# Patient Record
Sex: Female | Born: 1950 | Race: White | Hispanic: No | Smoking: Former smoker
Health system: Southern US, Community
[De-identification: ages and names within clinical notes are randomized; demographics above are authoritative.]

## PROBLEM LIST (undated history)

## (undated) DIAGNOSIS — E785 Hyperlipidemia, unspecified: Secondary | ICD-10-CM

## (undated) DIAGNOSIS — C50919 Malignant neoplasm of unspecified site of unspecified female breast: Secondary | ICD-10-CM

## (undated) DIAGNOSIS — K219 Gastro-esophageal reflux disease without esophagitis: Secondary | ICD-10-CM

## (undated) DIAGNOSIS — C439 Malignant melanoma of skin, unspecified: Secondary | ICD-10-CM

## (undated) DIAGNOSIS — E119 Type 2 diabetes mellitus without complications: Secondary | ICD-10-CM

## (undated) HISTORY — PX: MASTECTOMY, PARTIAL: SHX709

## (undated) HISTORY — PX: BREAST LUMPECTOMY: SHX2

---

## 2004-09-23 DIAGNOSIS — C50919 Malignant neoplasm of unspecified site of unspecified female breast: Secondary | ICD-10-CM

## 2004-09-23 HISTORY — PX: BREAST BIOPSY: SHX20

## 2004-09-23 HISTORY — DX: Malignant neoplasm of unspecified site of unspecified female breast: C50.919

## 2005-02-11 ENCOUNTER — Ambulatory Visit: Payer: Self-pay | Admitting: Internal Medicine

## 2005-02-18 ENCOUNTER — Ambulatory Visit: Payer: Self-pay | Admitting: Internal Medicine

## 2005-02-26 ENCOUNTER — Ambulatory Visit: Payer: Self-pay | Admitting: Internal Medicine

## 2005-03-06 ENCOUNTER — Ambulatory Visit: Payer: Self-pay | Admitting: Surgery

## 2005-03-22 ENCOUNTER — Ambulatory Visit: Payer: Self-pay | Admitting: Oncology

## 2005-04-01 ENCOUNTER — Ambulatory Visit: Payer: Self-pay | Admitting: Oncology

## 2005-04-23 ENCOUNTER — Ambulatory Visit: Payer: Self-pay | Admitting: Oncology

## 2005-05-24 ENCOUNTER — Ambulatory Visit: Payer: Self-pay | Admitting: Oncology

## 2005-06-23 ENCOUNTER — Ambulatory Visit: Payer: Self-pay | Admitting: Oncology

## 2005-11-06 ENCOUNTER — Ambulatory Visit: Payer: Self-pay | Admitting: Oncology

## 2005-12-18 ENCOUNTER — Ambulatory Visit: Payer: Self-pay | Admitting: Oncology

## 2005-12-24 ENCOUNTER — Ambulatory Visit: Payer: Self-pay | Admitting: Oncology

## 2006-01-21 ENCOUNTER — Ambulatory Visit: Payer: Self-pay | Admitting: Oncology

## 2006-05-05 ENCOUNTER — Ambulatory Visit: Payer: Self-pay | Admitting: Oncology

## 2006-06-24 ENCOUNTER — Ambulatory Visit: Payer: Self-pay | Admitting: Oncology

## 2006-11-10 DIAGNOSIS — E782 Mixed hyperlipidemia: Secondary | ICD-10-CM | POA: Insufficient documentation

## 2006-11-10 DIAGNOSIS — I10 Essential (primary) hypertension: Secondary | ICD-10-CM | POA: Insufficient documentation

## 2006-12-19 ENCOUNTER — Ambulatory Visit: Payer: Self-pay | Admitting: Nurse Practitioner

## 2006-12-22 ENCOUNTER — Ambulatory Visit: Payer: Self-pay | Admitting: Oncology

## 2006-12-23 ENCOUNTER — Ambulatory Visit: Payer: Self-pay | Admitting: Oncology

## 2007-04-24 ENCOUNTER — Ambulatory Visit: Payer: Self-pay | Admitting: Oncology

## 2007-05-07 ENCOUNTER — Ambulatory Visit: Payer: Self-pay | Admitting: Oncology

## 2007-05-25 ENCOUNTER — Ambulatory Visit: Payer: Self-pay | Admitting: Oncology

## 2007-06-24 ENCOUNTER — Ambulatory Visit: Payer: Self-pay | Admitting: Oncology

## 2007-12-21 ENCOUNTER — Ambulatory Visit: Payer: Self-pay | Admitting: Internal Medicine

## 2007-12-23 ENCOUNTER — Ambulatory Visit: Payer: Self-pay | Admitting: Oncology

## 2008-01-22 ENCOUNTER — Ambulatory Visit: Payer: Self-pay | Admitting: Oncology

## 2008-03-23 ENCOUNTER — Ambulatory Visit: Payer: Self-pay | Admitting: Oncology

## 2008-07-04 ENCOUNTER — Ambulatory Visit: Payer: Self-pay | Admitting: Oncology

## 2008-07-24 ENCOUNTER — Ambulatory Visit: Payer: Self-pay | Admitting: Oncology

## 2008-12-21 ENCOUNTER — Ambulatory Visit: Payer: Self-pay | Admitting: Oncology

## 2008-12-22 ENCOUNTER — Ambulatory Visit: Payer: Self-pay | Admitting: Oncology

## 2009-01-03 ENCOUNTER — Ambulatory Visit: Payer: Self-pay | Admitting: Oncology

## 2009-01-21 ENCOUNTER — Ambulatory Visit: Payer: Self-pay | Admitting: Oncology

## 2009-06-22 ENCOUNTER — Ambulatory Visit: Payer: Self-pay | Admitting: Internal Medicine

## 2009-06-28 ENCOUNTER — Encounter: Payer: Self-pay | Admitting: Internal Medicine

## 2009-07-24 ENCOUNTER — Encounter: Payer: Self-pay | Admitting: Internal Medicine

## 2009-09-04 DIAGNOSIS — E119 Type 2 diabetes mellitus without complications: Secondary | ICD-10-CM | POA: Insufficient documentation

## 2009-12-22 ENCOUNTER — Ambulatory Visit: Payer: Self-pay | Admitting: Oncology

## 2010-01-02 ENCOUNTER — Ambulatory Visit: Payer: Self-pay | Admitting: Oncology

## 2010-01-21 ENCOUNTER — Ambulatory Visit: Payer: Self-pay | Admitting: Oncology

## 2011-01-07 ENCOUNTER — Ambulatory Visit: Payer: Self-pay | Admitting: Oncology

## 2011-01-09 ENCOUNTER — Ambulatory Visit: Payer: Self-pay | Admitting: Oncology

## 2011-01-10 LAB — CANCER ANTIGEN 27.29: CA 27.29: 8.4 U/mL (ref 0.0–38.6)

## 2011-01-22 ENCOUNTER — Ambulatory Visit: Payer: Self-pay | Admitting: Oncology

## 2012-02-03 ENCOUNTER — Ambulatory Visit: Payer: Self-pay | Admitting: Oncology

## 2012-02-04 ENCOUNTER — Ambulatory Visit: Payer: Self-pay | Admitting: Oncology

## 2012-02-22 ENCOUNTER — Ambulatory Visit: Payer: Self-pay | Admitting: Oncology

## 2013-02-23 ENCOUNTER — Ambulatory Visit: Payer: Self-pay | Admitting: Internal Medicine

## 2014-05-11 ENCOUNTER — Ambulatory Visit: Payer: Self-pay | Admitting: Internal Medicine

## 2015-10-12 ENCOUNTER — Other Ambulatory Visit: Payer: Self-pay | Admitting: Internal Medicine

## 2015-10-12 DIAGNOSIS — Z1231 Encounter for screening mammogram for malignant neoplasm of breast: Secondary | ICD-10-CM

## 2015-10-16 ENCOUNTER — Ambulatory Visit
Admission: RE | Admit: 2015-10-16 | Discharge: 2015-10-16 | Disposition: A | Discharge status: Home / Self Care | Payer: Medicare Other | Source: Ambulatory Visit | Attending: Internal Medicine | Admitting: Internal Medicine

## 2015-10-16 ENCOUNTER — Other Ambulatory Visit: Payer: Self-pay | Admitting: Internal Medicine

## 2015-10-16 DIAGNOSIS — Z1231 Encounter for screening mammogram for malignant neoplasm of breast: Secondary | ICD-10-CM | POA: Insufficient documentation

## 2015-10-16 HISTORY — DX: Malignant neoplasm of unspecified site of unspecified female breast: C50.919

## 2015-12-01 ENCOUNTER — Encounter: Payer: Self-pay | Admitting: *Deleted

## 2015-12-04 ENCOUNTER — Encounter: Payer: Self-pay | Admitting: *Deleted

## 2015-12-04 ENCOUNTER — Ambulatory Visit: Payer: Medicare Other | Admitting: Anesthesiology

## 2015-12-04 ENCOUNTER — Encounter
Admission: RE | Disposition: A | Discharge status: Home / Self Care | Payer: Self-pay | Source: Ambulatory Visit | Attending: Gastroenterology

## 2015-12-04 ENCOUNTER — Ambulatory Visit
Admission: RE | Admit: 2015-12-04 | Discharge: 2015-12-04 | Disposition: A | Discharge status: Home / Self Care | Payer: Medicare Other | Source: Ambulatory Visit | Attending: Gastroenterology | Admitting: Gastroenterology

## 2015-12-04 DIAGNOSIS — K640 First degree hemorrhoids: Secondary | ICD-10-CM | POA: Insufficient documentation

## 2015-12-04 DIAGNOSIS — Z1211 Encounter for screening for malignant neoplasm of colon: Secondary | ICD-10-CM | POA: Diagnosis not present

## 2015-12-04 DIAGNOSIS — Z885 Allergy status to narcotic agent status: Secondary | ICD-10-CM | POA: Diagnosis not present

## 2015-12-04 DIAGNOSIS — Z85820 Personal history of malignant melanoma of skin: Secondary | ICD-10-CM | POA: Insufficient documentation

## 2015-12-04 DIAGNOSIS — D125 Benign neoplasm of sigmoid colon: Secondary | ICD-10-CM | POA: Diagnosis not present

## 2015-12-04 DIAGNOSIS — R12 Heartburn: Secondary | ICD-10-CM | POA: Diagnosis present

## 2015-12-04 DIAGNOSIS — K219 Gastro-esophageal reflux disease without esophagitis: Secondary | ICD-10-CM | POA: Insufficient documentation

## 2015-12-04 DIAGNOSIS — D122 Benign neoplasm of ascending colon: Secondary | ICD-10-CM | POA: Diagnosis not present

## 2015-12-04 DIAGNOSIS — Z7984 Long term (current) use of oral hypoglycemic drugs: Secondary | ICD-10-CM | POA: Diagnosis not present

## 2015-12-04 DIAGNOSIS — E119 Type 2 diabetes mellitus without complications: Secondary | ICD-10-CM | POA: Diagnosis not present

## 2015-12-04 DIAGNOSIS — E785 Hyperlipidemia, unspecified: Secondary | ICD-10-CM | POA: Insufficient documentation

## 2015-12-04 DIAGNOSIS — Z7982 Long term (current) use of aspirin: Secondary | ICD-10-CM | POA: Insufficient documentation

## 2015-12-04 DIAGNOSIS — Z87891 Personal history of nicotine dependence: Secondary | ICD-10-CM | POA: Diagnosis not present

## 2015-12-04 DIAGNOSIS — Z853 Personal history of malignant neoplasm of breast: Secondary | ICD-10-CM | POA: Insufficient documentation

## 2015-12-04 HISTORY — DX: Gastro-esophageal reflux disease without esophagitis: K21.9

## 2015-12-04 HISTORY — DX: Hyperlipidemia, unspecified: E78.5

## 2015-12-04 HISTORY — PX: COLONOSCOPY WITH PROPOFOL: SHX5780

## 2015-12-04 HISTORY — DX: Malignant melanoma of skin, unspecified: C43.9

## 2015-12-04 HISTORY — DX: Type 2 diabetes mellitus without complications: E11.9

## 2015-12-04 LAB — GLUCOSE, CAPILLARY: Glucose-Capillary: 110 mg/dL — ABNORMAL HIGH (ref 65–99)

## 2015-12-04 SURGERY — COLONOSCOPY WITH PROPOFOL
Anesthesia: General

## 2015-12-04 MED ORDER — LIDOCAINE HCL (CARDIAC) 20 MG/ML IV SOLN
INTRAVENOUS | Status: DC | PRN
  Administered 2015-12-04: 100 mg via INTRAVENOUS

## 2015-12-04 MED ORDER — PROPOFOL 500 MG/50ML IV EMUL
INTRAVENOUS | Status: DC | PRN
  Administered 2015-12-04: 100 ug/kg/min via INTRAVENOUS

## 2015-12-04 MED ORDER — GLYCOPYRROLATE 0.2 MG/ML IJ SOLN
INTRAMUSCULAR | Status: DC | PRN
  Administered 2015-12-04: 0.2 mg via INTRAVENOUS

## 2015-12-04 MED ORDER — PHENYLEPHRINE HCL 10 MG/ML IJ SOLN
INTRAMUSCULAR | Status: DC | PRN
  Administered 2015-12-04: 100 ug via INTRAVENOUS

## 2015-12-04 MED ORDER — SODIUM CHLORIDE 0.9 % IV SOLN
INTRAVENOUS | Status: DC
  Administered 2015-12-04: 08:00:00 via INTRAVENOUS

## 2015-12-04 MED ORDER — PROPOFOL 10 MG/ML IV BOLUS
INTRAVENOUS | Status: DC | PRN
  Administered 2015-12-04: 70 mg via INTRAVENOUS

## 2015-12-04 MED ORDER — FENTANYL CITRATE (PF) 100 MCG/2ML IJ SOLN
INTRAMUSCULAR | Status: DC | PRN
  Administered 2015-12-04: 50 ug via INTRAVENOUS

## 2015-12-04 MED ORDER — MIDAZOLAM HCL 5 MG/5ML IJ SOLN
INTRAMUSCULAR | Status: DC | PRN
  Administered 2015-12-04: 1 mg via INTRAVENOUS

## 2015-12-04 NOTE — H&P (Signed)
  Primary Care Physician:  Perrin Maltese, MD  Pre-Procedure History & Physical: HPI:  Phyllis Hall is a 65 y.o. female is here for an endoscopy/colonoscopy   Past Medical History  Diagnosis Date  . Breast cancer ([place]) 2006    right breast with lumpectomy and rad tx  . Diabetes mellitus without complication ([place])   . Hyperlipidemia   . Melanoma ([place])   . GERD (gastroesophageal reflux disease)     Past Surgical History  Procedure Laterality Date  . Mastectomy, partial    . Breast lumpectomy      Prior to Admission medications   Medication Sig Start Date End Date Taking? Authorizing Provider  aspirin 81 MG tablet Take 81 mg by mouth daily.   Yes Historical Provider, MD  calcium carbonate (OSCAL) 1500 (600 Ca) MG TABS tablet Take 600 mg of elemental calcium by mouth 2 (two) times daily with a meal.   Yes Historical Provider, MD  esomeprazole (NEXIUM) 20 MG capsule Take 20 mg by mouth daily at 12 noon.   Yes Historical Provider, MD  fluticasone (FLONASE) 50 MCG/ACT nasal spray Place into both nostrils daily.   Yes Historical Provider, MD  metFORMIN (GLUCOPHAGE) 500 MG tablet Take by mouth 2 (two) times daily with a meal.   Yes Historical Provider, MD  montelukast (SINGULAIR) 10 MG tablet Take 10 mg by mouth at bedtime.   Yes Historical Provider, MD  rosuvastatin (CRESTOR) 20 MG tablet Take 20 mg by mouth daily.   Yes Historical Provider, MD    Allergies as of 12/01/2015 - Review Complete 12/01/2015  Allergen Reaction Noted  . Codeine  12/01/2015    Family History  Problem Relation Age of Onset  . Breast cancer Maternal Aunt 6    Social History   Social History  . Marital Status: Married    Spouse Name: N/A  . Number of Children: N/A  . Years of Education: N/A   Occupational History  . Not on file.   Social History Main Topics  . Smoking status: Former Smoker    Quit date: 10/13/1993  . Smokeless tobacco: Not on file  . Alcohol Use: 0.0 oz/week    0 Glasses  of wine per week  . Drug Use: No  . Sexual Activity: Not on file   Other Topics Concern  . Not on file   Social History Narrative     Physical Exam: BP 133/85 mmHg  Pulse 84  Temp(Src) 96.2 F (35.7 C) (Tympanic)  Resp 17  Ht 5\' 5"  (1.651 m)  Wt 75.297 kg (166 lb)  BMI 27.62 kg/m2  SpO2 99% General:   Alert,  pleasant and cooperative in NAD Head:  Normocephalic and atraumatic. Neck:  Supple; no masses or thyromegaly. Lungs:  Clear throughout to auscultation.    Heart:  Regular rate and rhythm. Abdomen:  Soft, nontender and nondistended. Normal bowel sounds, without guarding, and without rebound.   Neurologic:  Alert and  oriented x4;  grossly normal neurologically.  Impression/Plan: Phyllis Hall is here for an endoscopy to be performed for GERD, colon for screening  Risks, benefits, limitations, and alternatives regarding  Endoscopy/colonoscopy have been reviewed with the patient.  Questions have been answered.  All parties agreeable.   Josefine Class, MD  12/04/2015, 8:42 AM

## 2015-12-04 NOTE — Transfer of Care (Signed)
Immediate Anesthesia Transfer of Care Note  Patient: Phyllis Hall  Procedure(s) Performed: Procedure(s): COLONOSCOPY WITH PROPOFOL (N/A)  Patient Location: PACU  Anesthesia Type:General  Level of Consciousness: awake and patient cooperative  Airway & Oxygen Therapy: Patient Spontanous Breathing and Patient connected to nasal cannula oxygen  Post-op Assessment: Report given to RN and Post -op Vital signs reviewed and stable  Post vital signs: Reviewed and stable  Last Vitals:  Filed Vitals:   12/04/15 0935 12/04/15 0936  BP: 99/70 99/70  Pulse: 85 82  Temp: 36 C 36 C  Resp:  14    Complications: No apparent anesthesia complications

## 2015-12-04 NOTE — Op Note (Signed)
[address] [facility] Gastroenterology Patient Name: Phyllis Hall Procedure Date: 12/04/2015 8:43 AM MRN: [id number] Account #: [id number] Date of Birth: 1950-12-18 Admit Type: Outpatient Age: 65 Room: [facility] ENDO ROOM 1 Gender: Female Note Status: Finalized Procedure:            Upper GI endoscopy Indications:          Heartburn, Suspected esophageal reflux Patient Profile:      This is a 65 year old female. Providers:            Gerrit Heck. Rayann Heman, MD Referring MD:         Perrin Maltese, MD (Referring MD) Medicines:            Propofol per Anesthesia Complications:        No immediate complications. Procedure:            Pre-Anesthesia Assessment:                       - Prior to the procedure, a History and Physical was                        performed, and patient medications, allergies and                        sensitivities were reviewed. The patient's tolerance of                        previous anesthesia was reviewed.                       After obtaining informed consent, the endoscope was                        passed under direct vision. Throughout the procedure,                        the patient's blood pressure, pulse, and oxygen                        saturations were monitored continuously. The Endoscope                        was introduced through the mouth, and advanced to the                        second part of duodenum. The upper GI endoscopy was                        accomplished without difficulty. The patient tolerated                        the procedure well. Findings:      The esophagus was normal.      The stomach was normal.      The examined duodenum was normal.      Three biopsies were obtained with cold forceps for histology randomly at       the gastroesophageal junction. Impression:           - Normal esophagus.                       - Normal  stomach.                       - Normal examined duodenum.                       - Three  biopsies were obtained at the gastroesophageal                        junction. Recommendation:       - Perform a colonoscopy today.                       - Await pathology results.                       - Taking lowest dose of anti-acid medicine that                        effectively treats symptoms.                       - The findings and recommendations were discussed with                        the patient.                       - The findings and recommendations were discussed with                        the patient's family. Procedure Code(s):    --- Professional ---                       [phone], Esophagogastroduodenoscopy, flexible, transoral;                        with biopsy, single or multiple Diagnosis Code(s):    --- Professional ---                       R12, Heartburn CPT copyright 2016 American Medical Association. All rights reserved. The codes documented in this report are preliminary and upon coder review may  be revised to meet current compliance requirements. Mellody Life, MD 12/04/2015 9:01:49 AM This report has been signed electronically. Number of Addenda: 0 Note Initiated On: 12/04/2015 8:43 AM      [facility]

## 2015-12-04 NOTE — Op Note (Signed)
[facility] Gastroenterology Patient Name: Phyllis Hall Procedure Date: 12/04/2015 9:02 AM MRN: [id number] Account #: [id number] Date of Birth: 03/29/1951 Admit Type: Outpatient Age: 65 Room: [facility] ENDO ROOM 1 Gender: Female Note Status: Finalized Procedure:            Colonoscopy Indications:          Screening for colorectal malignant neoplasm, Last                        colonoscopy 10 years ago Patient Profile:      This is a 65 year old female. Providers:            Gerrit Heck. Rayann Heman, MD Referring MD:         Perrin Maltese, MD (Referring MD) Medicines:            Propofol per Anesthesia Complications:        No immediate complications. Procedure:            Pre-Anesthesia Assessment:                       - Prior to the procedure, a History and Physical was                        performed, and patient medications, allergies and                        sensitivities were reviewed. The patient's tolerance of                        previous anesthesia was reviewed.                       After obtaining informed consent, the colonoscope was                        passed under direct vision. Throughout the procedure,                        the patient's blood pressure, pulse, and oxygen                        saturations were monitored continuously. The                        Colonoscope was introduced through the anus and                        advanced to the the cecum, identified by appendiceal                        orifice and ileocecal valve. The colonoscopy was                        performed without difficulty. The patient tolerated the                        procedure well. The quality of the bowel preparation                        was good. Findings:      The perianal  and digital rectal examinations were normal.      A 4 mm polyp was found in the ascending colon. The polyp was sessile.       The polyp was removed with a cold snare. Resection and  retrieval were       complete.      A 3 mm polyp was found in the recto-sigmoid colon. The polyp was       sessile. The polyp was removed with a jumbo cold forceps. Resection and       retrieval were complete.      Internal hemorrhoids were found during retroflexion. The hemorrhoids       were Grade I (internal hemorrhoids that do not prolapse).      The exam was otherwise without abnormality. Impression:           - One 4 mm polyp in the ascending colon, removed with a                        cold snare. Resected and retrieved.                       - One 3 mm polyp at the recto-sigmoid colon, removed                        with a jumbo cold forceps. Resected and retrieved.                       - Internal hemorrhoids.                       - The examination was otherwise normal. Recommendation:       - Observe patient in GI recovery unit.                       - Resume regular diet.                       - Continue present medications.                       - Await pathology results.                       - Repeat colonoscopy for surveillance based on                        pathology results.                       - Return to referring physician.                       - The findings and recommendations were discussed with                        the patient.                       - The findings and recommendations were discussed with                        the patient's family. Procedure Code(s):    --- Professional ---  45385, Colonoscopy, flexible; with removal of tumor(s),                        polyp(s), or other lesion(s) by snare technique                       45380, 59, Colonoscopy, flexible; with biopsy, single                        or multiple Diagnosis Code(s):    --- Professional ---                       Z12.11, Encounter for screening for malignant neoplasm                        of colon                       D12.2, Benign neoplasm of ascending  colon                       D12.7, Benign neoplasm of rectosigmoid junction                       K64.0, First degree hemorrhoids CPT copyright 2016 American Medical Association. All rights reserved. The codes documented in this report are preliminary and upon coder review may  be revised to meet current compliance requirements. Mellody Life, MD 12/04/2015 9:30:42 AM This report has been signed electronically. Number of Addenda: 0 Note Initiated On: 12/04/2015 9:02 AM Scope Withdrawal Time: 0 hours 16 minutes 28 seconds  Total Procedure Duration: 0 hours 22 minutes 13 seconds       [facility]

## 2015-12-04 NOTE — Anesthesia Preprocedure Evaluation (Signed)
Anesthesia Evaluation  Patient identified by MRN, date of birth, ID band Patient awake    Reviewed: Allergy & Precautions, NPO status , Patient's Chart, lab work & pertinent test results  History of Anesthesia Complications Negative for: history of anesthetic complications  Airway Mallampati: II       Dental  (+) Edentulous Upper, Edentulous Lower   Pulmonary former smoker,    breath sounds clear to auscultation       Cardiovascular Exercise Tolerance: Good  Rhythm:Regular Rate:Normal     Neuro/Psych    GI/Hepatic Neg liver ROS, GERD  ,  Endo/Other  diabetes, Well Controlled, Type 2, Oral Hypoglycemic Agents  Renal/GU negative Renal ROS     Musculoskeletal   Abdominal Normal abdominal exam  (+)   Peds  Hematology   Anesthesia Other Findings   Reproductive/Obstetrics                             Anesthesia Physical Anesthesia Plan  ASA: II  Anesthesia Plan: General   Post-op Pain Management:    Induction: Intravenous  Airway Management Planned: Natural Airway and Nasal Cannula  Additional Equipment:   Intra-op Plan:   Post-operative Plan:   Informed Consent: I have reviewed the patients History and Physical, chart, labs and discussed the procedure including the risks, benefits and alternatives for the proposed anesthesia with the patient or authorized representative who has indicated his/her understanding and acceptance.     Plan Discussed with: CRNA  Anesthesia Plan Comments:         Anesthesia Quick Evaluation

## 2015-12-04 NOTE — Discharge Instructions (Signed)

## 2015-12-04 NOTE — Anesthesia Postprocedure Evaluation (Signed)
Anesthesia Post Note  Patient: Phyllis Hall  Procedure(s) Performed: Procedure(s) (LRB): COLONOSCOPY WITH PROPOFOL (N/A)  Patient location during evaluation: PACU Anesthesia Type: General Level of consciousness: awake Pain management: satisfactory to patient Vital Signs Assessment: post-procedure vital signs reviewed and stable Respiratory status: nonlabored ventilation Cardiovascular status: stable Anesthetic complications: no    Last Vitals:  Filed Vitals:   12/04/15 0945 12/04/15 0955  BP: 111/73 119/81  Pulse: 76 71  Temp:    Resp: 16 16    Last Pain: There were no vitals filed for this visit.               VAN STAVEREN,Ashanti Littles

## 2015-12-05 ENCOUNTER — Encounter: Payer: Self-pay | Admitting: Gastroenterology

## 2015-12-05 LAB — SURGICAL PATHOLOGY

## 2016-08-02 ENCOUNTER — Ambulatory Visit: Payer: Medicare Other | Admitting: Podiatry

## 2016-11-11 ENCOUNTER — Other Ambulatory Visit: Payer: Self-pay | Admitting: Internal Medicine

## 2016-11-11 DIAGNOSIS — Z1231 Encounter for screening mammogram for malignant neoplasm of breast: Secondary | ICD-10-CM

## 2016-11-26 ENCOUNTER — Ambulatory Visit
Admission: RE | Admit: 2016-11-26 | Discharge: 2016-11-26 | Disposition: A | Discharge status: Home / Self Care | Payer: Medicare Other | Source: Ambulatory Visit | Attending: Internal Medicine | Admitting: Internal Medicine

## 2016-11-26 DIAGNOSIS — Z1231 Encounter for screening mammogram for malignant neoplasm of breast: Secondary | ICD-10-CM | POA: Insufficient documentation

## 2017-11-07 ENCOUNTER — Other Ambulatory Visit: Payer: Self-pay | Admitting: Internal Medicine

## 2017-11-07 DIAGNOSIS — Z1231 Encounter for screening mammogram for malignant neoplasm of breast: Secondary | ICD-10-CM

## 2017-12-03 ENCOUNTER — Ambulatory Visit
Admission: RE | Admit: 2017-12-03 | Discharge: 2017-12-03 | Disposition: A | Discharge status: Home / Self Care | Payer: Medicare Other | Source: Ambulatory Visit | Attending: Internal Medicine | Admitting: Internal Medicine

## 2017-12-03 DIAGNOSIS — Z1231 Encounter for screening mammogram for malignant neoplasm of breast: Secondary | ICD-10-CM | POA: Insufficient documentation

## 2020-01-18 ENCOUNTER — Other Ambulatory Visit: Payer: Self-pay | Admitting: Internal Medicine

## 2021-08-10 LAB — HM DIABETES EYE EXAM

## 2022-05-29 ENCOUNTER — Other Ambulatory Visit: Payer: Self-pay | Admitting: Internal Medicine

## 2022-05-29 DIAGNOSIS — Z1231 Encounter for screening mammogram for malignant neoplasm of breast: Secondary | ICD-10-CM

## 2022-05-29 DIAGNOSIS — N951 Menopausal and female climacteric states: Secondary | ICD-10-CM

## 2022-06-18 ENCOUNTER — Ambulatory Visit
Admission: RE | Admit: 2022-06-18 | Discharge: 2022-06-18 | Disposition: A | Discharge status: Home / Self Care | Payer: Medicare Other | Source: Ambulatory Visit | Attending: Internal Medicine | Admitting: Internal Medicine

## 2022-06-18 DIAGNOSIS — E119 Type 2 diabetes mellitus without complications: Secondary | ICD-10-CM | POA: Diagnosis not present

## 2022-06-18 DIAGNOSIS — M8589 Other specified disorders of bone density and structure, multiple sites: Secondary | ICD-10-CM | POA: Diagnosis not present

## 2022-06-18 DIAGNOSIS — M069 Rheumatoid arthritis, unspecified: Secondary | ICD-10-CM | POA: Diagnosis not present

## 2022-06-18 DIAGNOSIS — N951 Menopausal and female climacteric states: Secondary | ICD-10-CM

## 2022-06-18 DIAGNOSIS — Z1231 Encounter for screening mammogram for malignant neoplasm of breast: Secondary | ICD-10-CM | POA: Insufficient documentation

## 2022-06-18 DIAGNOSIS — Z780 Asymptomatic menopausal state: Secondary | ICD-10-CM | POA: Insufficient documentation

## 2022-06-18 DIAGNOSIS — Z853 Personal history of malignant neoplasm of breast: Secondary | ICD-10-CM | POA: Diagnosis not present

## 2022-07-29 NOTE — Progress Notes (Unsigned)
There were no vitals taken for this visit.   Subjective:    Patient ID: Phyllis Hall, female    DOB: January 06, 1951, 71 y.o.   MRN: [id number]  HPI: Phyllis Hall is a 71 y.o. female  No chief complaint on file.  Patient presents to clinic to establish care with new PCP.  Introduced to Designer, jewellery role and practice setting.  All questions answered.  Discussed provider/patient relationship and expectations.  Patient reports a history of ***. Patient denies a history of: Hypertension, Elevated Cholesterol, Diabetes, Thyroid problems, Depression, Anxiety, Neurological problems, and Abdominal problems.   Active Ambulatory Problems    Diagnosis Date Noted   Benign essential hypertension 11/10/2006   Diabetes mellitus type 2, controlled, without complications ([place]) 62/26/3335   Mixed hyperlipidemia 11/10/2006   Resolved Ambulatory Problems    Diagnosis Date Noted   No Resolved Ambulatory Problems   Past Medical History:  Diagnosis Date   Breast cancer ([place]) 2006   Diabetes mellitus without complication ([place])    GERD (gastroesophageal reflux disease)    Hyperlipidemia    Melanoma ([place])    Past Surgical History:  Procedure Laterality Date   BREAST BIOPSY Right 2006   Breast Cancer   BREAST LUMPECTOMY     COLONOSCOPY WITH PROPOFOL N/A 12/04/2015   Procedure: COLONOSCOPY WITH PROPOFOL;  Surgeon: Josefine Class, MD;  Location: [facility] ENDOSCOPY;  Service: Endoscopy;  Laterality: N/A;   MASTECTOMY, PARTIAL     Family History  Problem Relation Age of Onset   Breast cancer Maternal Aunt 35     Review of Systems  Per HPI unless specifically indicated above     Objective:    There were no vitals taken for this visit.  Wt Readings from Last 3 Encounters:  12/04/15 166 lb (75.3 kg)    Physical Exam  Results for orders placed or performed during the hospital encounter of 12/04/15  Glucose, capillary  Result Value Ref Range   Glucose-Capillary 110 (H) 65 -  99 mg/dL  Surgical pathology  Result Value Ref Range   SURGICAL PATHOLOGY      Surgical Pathology CASE: [id number] PATIENT: Percell Locus Surgical Pathology Report     SPECIMEN SUBMITTED: A. GEJ, bx's B. Colon polyp, ascending, cold snare C. Colon polyp, recto-sigmoid, cold bx  CLINICAL HISTORY: None provided  PRE-OPERATIVE DIAGNOSIS: Screening, GERD  POST-OPERATIVE DIAGNOSIS: Reflux, colon polyps hemorrhoids     DIAGNOSIS: A. GEJ; BIOPSY: - UNREMARKABLE SQUAMOUS AND COLUMNAR MUCOSA.  B. COLON POLYP, ASCENDING; COLD SNARE: - SESSILE SERRATED ADENOMA. - NEGATIVE FOR HIGH-GRADE DYSPLASIA AND MALIGNANCY.  C. COLON POLYP, RECTOSIGMOID; COLD BIOPSY: - HYPERPLASTIC POLYP. - NEGATIVE FOR DYSPLASIA AND MALIGNANCY.  Comment: Sessile serrated adenomas (SSAs) are often difficult to detect endoscopically as they are typically broad flat lesions found in the proximal colon. By morphologic definition, SSAs demonstrate architectural (low grade) dysplasia, with or without concurrent cytologic dysplasia. SSAs are thought to be precurs or lesions to a subset of colonic adenocarcinomas that arise through the serrated neoplastic pathway rather than the classical neoplasia pathway. Lesions of the serrated pathway have a high frequency of BRAF mutation and are more likely to be microsatellite unstable compared to tubular / villous adenomas of the classical pathway.  The recommended surveillance interval for a SSA <10 mm with no cytologic dysplasia is 5 years.  A SSA ?10 mm and a sessile serrated polyp with cytological dysplasia should be managed like a high risk adenoma (recommended surveillance interval of 3 years).  Serrated polyposis syndrome, as defined by WHO, should be considered with one of the following criteria: (1) at least 5 serrated polyps proximal to sigmoid, with 2 or more ?10 mm; (2) any serrated polyps proximal to sigmoid with family history of serrated  polyposis syndrome; and (3) >20 serrated polyps of any size throughout the colon.  References: Guidelines for Colonoscopy Surveillanc e After Screening and Polypectomy: A Consensus Update by the [place] Multi-Society Task Force on Colorectal Cancer. [place] [facility] [employer], 2012.    GROSS DESCRIPTION: A. Labeled: GEJ biopsies  Tissue fragment(s): 2  Size: 0.2 and 0.4 cm  Description: Tan tissue fragments  Entirely submitted in one cassette(s).   B. Labeled: Cold snare ascending colon polyp  Tissue fragment(s): 1  Size: 0.3 cm  Description: Tan tissue fragment admixed with fecal matter  Entirely submitted in one cassette(s).   C. Labeled: Cold biopsy rectosigmoid polyp  Tissue fragment(s): 2  Size: 0.2-0.5 cm  Description: Tan tissue fragments  Entirely submitted in one cassette(s).     Final Diagnosis performed by Quay Burow, MD.  Electronically signed 12/05/2015 11:30:32AM    The electronic signature indicates that the named Attending Pathologist has evaluated the specimen  Technical component performed at [facility], [address] [place], [place] [address] [place] b: [phone] Dir: Darrick Penna. Evette Doffing, MD  Professional component performed at [facility], [facility], [place], [place], [place] [address] Lab: [phone] Dir: Dellia Nims. Rubinas, MD        Assessment & Plan:   Problem List Items Addressed This Visit       Cardiovascular and Mediastinum   Benign essential hypertension - Primary     Endocrine   Diabetes mellitus type 2, controlled, without complications ([place])     Other   Mixed hyperlipidemia     Follow up plan: No follow-ups on file.

## 2022-07-30 ENCOUNTER — Ambulatory Visit (INDEPENDENT_AMBULATORY_CARE_PROVIDER_SITE_OTHER): Payer: Medicare Other | Admitting: Nurse Practitioner

## 2022-07-30 ENCOUNTER — Encounter: Payer: Self-pay | Admitting: Nurse Practitioner

## 2022-07-30 VITALS — BP 121/73 | HR 72 | Temp 98.2°F | Ht 65.75 in | Wt 164.2 lb

## 2022-07-30 DIAGNOSIS — E119 Type 2 diabetes mellitus without complications: Secondary | ICD-10-CM | POA: Diagnosis not present

## 2022-07-30 DIAGNOSIS — Z1159 Encounter for screening for other viral diseases: Secondary | ICD-10-CM

## 2022-07-30 DIAGNOSIS — E782 Mixed hyperlipidemia: Secondary | ICD-10-CM | POA: Diagnosis not present

## 2022-07-30 DIAGNOSIS — M810 Age-related osteoporosis without current pathological fracture: Secondary | ICD-10-CM

## 2022-07-30 DIAGNOSIS — I10 Essential (primary) hypertension: Secondary | ICD-10-CM | POA: Diagnosis not present

## 2022-07-30 LAB — MICROALBUMIN, URINE WAIVED
Creatinine, Urine Waived: 200 mg/dL (ref 10–300)
Microalb, Ur Waived: 30 mg/L — ABNORMAL HIGH (ref 0–19)
Microalb/Creat Ratio: <30 mg/g (ref <30)

## 2022-07-30 NOTE — Assessment & Plan Note (Signed)
Chronic. Dexa scan done in September 2023. On Fosamax.  Tolerating medication well.

## 2022-07-30 NOTE — Assessment & Plan Note (Signed)
Chronic.  Controlled.  Continue with current medication regimen of Crestor '20mg'$  daily.  Labs ordered today.  Return to clinic in 6 months for reevaluation.  Call sooner if concerns arise.

## 2022-07-30 NOTE — Assessment & Plan Note (Signed)
Chronic.  Controlled without medication.  Would benefit from ACE/ARB for kidney protection in the setting of diabetes. Labs ordered today.  Return to clinic in 3 months for reevaluation.  Call sooner if concerns arise.

## 2022-07-30 NOTE — Assessment & Plan Note (Signed)
Chronic. Well controlled.  Not able to view previous labs.  Will check labs at visit today.  Continue with Janumet. Follow up in 3 months.  Call sooner if concerns arise.

## 2022-07-31 LAB — HEMOGLOBIN A1C
Est. average glucose Bld gHb Est-mCnc: 131 mg/dL
Hgb A1c MFr Bld: 6.2 % — ABNORMAL HIGH (ref 4.8–5.6)

## 2022-07-31 LAB — COMPREHENSIVE METABOLIC PANEL
ALT: 11 IU/L (ref 0–32)
AST: 17 IU/L (ref 0–40)
Albumin/Globulin Ratio: 1.8 (ref 1.2–2.2)
Albumin: 4.5 g/dL (ref 3.8–4.8)
Alkaline Phosphatase: 79 IU/L (ref 44–121)
BUN/Creatinine Ratio: 12 (ref 12–28)
BUN: 11 mg/dL (ref 8–27)
Bilirubin Total: 0.4 mg/dL (ref 0.0–1.2)
CO2: 25 mmol/L (ref 20–29)
Calcium: 9.3 mg/dL (ref 8.7–10.3)
Chloride: 101 mmol/L (ref 96–106)
Creatinine, Ser: 0.93 mg/dL (ref 0.57–1.00)
Globulin, Total: 2.5 g/dL (ref 1.5–4.5)
Glucose: 121 mg/dL — ABNORMAL HIGH (ref 70–99)
Potassium: 4.3 mmol/L (ref 3.5–5.2)
Sodium: 142 mmol/L (ref 134–144)
Total Protein: 7 g/dL (ref 6.0–8.5)
eGFR: 65 mL/min/{1.73_m2} (ref >59)

## 2022-07-31 LAB — LIPID PANEL
Chol/HDL Ratio: 2.7 ratio (ref 0.0–4.4)
Cholesterol, Total: 140 mg/dL (ref 100–199)
HDL: 52 mg/dL (ref >39)
LDL Chol Calc (NIH): 68 mg/dL (ref 0–99)
Triglycerides: 109 mg/dL (ref 0–149)
VLDL Cholesterol Cal: 20 mg/dL (ref 5–40)

## 2022-07-31 LAB — HEPATITIS C ANTIBODY: Hep C Virus Ab: NONREACTIVE

## 2022-07-31 NOTE — Progress Notes (Signed)
Please let patient know that her lab work shows that her lab work looks good.  A1c is well controlled at 6.2.  Liver, kidneys, electrolytes and cholesterol look great.  Follow up as discussed.

## 2022-09-20 ENCOUNTER — Encounter: Payer: Self-pay | Admitting: Nurse Practitioner

## 2022-10-29 ENCOUNTER — Other Ambulatory Visit: Payer: Self-pay | Admitting: Internal Medicine

## 2022-10-29 DIAGNOSIS — F409 Phobic anxiety disorder, unspecified: Secondary | ICD-10-CM

## 2022-10-30 ENCOUNTER — Encounter: Payer: Self-pay | Admitting: Nurse Practitioner

## 2022-10-30 ENCOUNTER — Ambulatory Visit (INDEPENDENT_AMBULATORY_CARE_PROVIDER_SITE_OTHER): Payer: Medicare Other | Admitting: Nurse Practitioner

## 2022-10-30 VITALS — BP 118/75 | HR 67 | Temp 98.8°F | Wt 167.7 lb

## 2022-10-30 DIAGNOSIS — E782 Mixed hyperlipidemia: Secondary | ICD-10-CM

## 2022-10-30 DIAGNOSIS — M810 Age-related osteoporosis without current pathological fracture: Secondary | ICD-10-CM | POA: Diagnosis not present

## 2022-10-30 DIAGNOSIS — Z7189 Other specified counseling: Secondary | ICD-10-CM

## 2022-10-30 DIAGNOSIS — I10 Essential (primary) hypertension: Secondary | ICD-10-CM | POA: Diagnosis not present

## 2022-10-30 DIAGNOSIS — E119 Type 2 diabetes mellitus without complications: Secondary | ICD-10-CM | POA: Diagnosis not present

## 2022-10-30 DIAGNOSIS — Z0000 Encounter for general adult medical examination without abnormal findings: Secondary | ICD-10-CM | POA: Diagnosis not present

## 2022-10-30 MED ORDER — LISINOPRIL 2.5 MG PO TABS: 2.5000 mg | ORAL_TABLET | Freq: Every day | ORAL | 1 refills | Status: DC

## 2022-10-30 NOTE — Assessment & Plan Note (Signed)
Chronic.  Controlled.  Continue with current medication regimen of Crestor '20mg'$  daily.  Labs ordered today.  Return to clinic in 6 months for reevaluation.  Call sooner if concerns arise.

## 2022-10-30 NOTE — Assessment & Plan Note (Signed)
A voluntary discussion about advance care planning including the explanation and discussion of advance directives was extensively discussed  with the patient for 10 minutes with patient. Explanation about the health care proxy and Living will was reviewed and packet with forms with explanation of how to fill them out was given.  During this discussion, the patient was able to identify a health care proxy as Phyllis Bible.  She has an advance care plan already and does not wish to make changes.

## 2022-10-30 NOTE — Assessment & Plan Note (Signed)
Chronic.  Controlled.  Continue with current medication regimen.  Last A1c 6.2 %.  Eye exam due next month.  Labs ordered today.  Return to clinic in 6 months for reevaluation.  Call sooner if concerns arise.

## 2022-10-30 NOTE — Addendum Note (Signed)
Addended by: Jon Billings on: 10/30/2022 11:09 AM   Modules accepted: Orders

## 2022-10-30 NOTE — Assessment & Plan Note (Signed)
Chronic. Dexa scan done in September 2023. On Fosamax.  Tolerating medication well.

## 2022-10-30 NOTE — Progress Notes (Signed)
BP 118/75 (BP Location: Left Arm, Cuff Size: Normal)   Pulse 67   Temp 98.8 F (37.1 C) (Oral)   Wt 167 lb 11.2 oz (76.1 kg)   SpO2 96%   BMI 27.28 kg/m    Subjective:    Patient ID: Phyllis Hall, female    DOB: 01-22-1951, 72 y.o.   MRN: [id number]  HPI: Phyllis Hall is a 72 y.o. female presenting on 10/30/2022 for comprehensive medical examination. Current medical complaints include:none  She currently lives with: Menopausal Symptoms: no  HYPERTENSION without Chronic Kidney Disease Hypertension status: controlled  Satisfied with current treatment? yes Duration of hypertension: years BP monitoring frequency:  not checking BP range:  BP medication side effects:  no Medication compliance: excellent compliance Previous BP meds:none Aspirin: no Recurrent headaches: no Visual changes: no Palpitations: no Dyspnea: no Chest pain: no Lower extremity edema: no Dizzy/lightheaded: no  DIABETES Hypoglycemic episodes:no Polydipsia/polyuria: no Visual disturbance: no Chest pain: no Paresthesias: no Glucose Monitoring: no  Accucheck frequency: Not Checking  Fasting glucose:  Post prandial:  Evening:  Before meals: Taking Insulin?: no  Long acting insulin:  Short acting insulin: Blood Pressure Monitoring: not checking Retinal Examination:  Due next month for Eye exam Foot Exam: Up to Date Diabetic Education: Not Completed Pneumovax: Up to Date Influenza: Up to Date Aspirin: no   Functional Status Survey: Is the patient deaf or have difficulty hearing?: No Does the patient have difficulty seeing, even when wearing glasses/contacts?: No Does the patient have difficulty concentrating, remembering, or making decisions?: No Does the patient have difficulty walking or climbing stairs?: No Does the patient have difficulty dressing or bathing?: No Does the patient have difficulty doing errands alone such as visiting a doctor's office or shopping?: No      10/30/2022   10:06 AM  [place] in the past year? 0  Number falls in past yr: 0  Injury with Fall? 0  Risk for fall due to : No Fall Risks  Follow up Falls evaluation completed    Depression Screen    10/30/2022   10:06 AM  Depression screen PHQ 2/9  Decreased Interest 0  Down, Depressed, Hopeless 0  PHQ - 2 Score 0  Altered sleeping 0  Tired, decreased energy 0  Change in appetite 0  Feeling bad or failure about yourself  0  Trouble concentrating 0  Moving slowly or fidgety/restless 0  Suicidal thoughts 0  PHQ-9 Score 0  Difficult doing work/chores Not difficult at all     Advanced Directives Does patient have a HCPOA?    yes If yes, name and contact information: Dennie Bible Does patient have a living will or MOST form?  no  Past Medical History:  Past Medical History:  Diagnosis Date   Breast cancer ([place]) 2006   right breast with lumpectomy and rad tx   Diabetes mellitus without complication ([place])    GERD (gastroesophageal reflux disease)    Hyperlipidemia    Melanoma ([place])     Surgical History:  Past Surgical History:  Procedure Laterality Date   BREAST BIOPSY Right 2006   Breast Cancer   BREAST LUMPECTOMY     COLONOSCOPY WITH PROPOFOL N/A 12/04/2015   Procedure: COLONOSCOPY WITH PROPOFOL;  Surgeon: Josefine Class, MD;  Location: [facility] ENDOSCOPY;  Service: Endoscopy;  Laterality: N/A;   MASTECTOMY, PARTIAL      Medications:  Current Outpatient Medications on File Prior to Visit  Medication Sig  alendronate (FOSAMAX) 70 MG tablet Take 70 mg by mouth once a week.   amitriptyline (ELAVIL) 10 MG tablet TAKE 2 TABLETS BY MOUTH EVERY NIGHT AT BEDTIME   aspirin 81 MG tablet Take 81 mg by mouth daily.   calcium carbonate (OSCAL) 1500 (600 Ca) MG TABS tablet Take 600 mg of elemental calcium by mouth 2 (two) times daily with a meal.   fluticasone (FLONASE) 50 MCG/ACT nasal spray Place into both nostrils daily.   JANUMET 50-1000 MG tablet Take 1 tablet  by mouth 2 (two) times daily with a meal.   montelukast (SINGULAIR) 10 MG tablet Take 10 mg by mouth at bedtime.   pantoprazole (PROTONIX) 40 MG tablet TAKE 1 TABLET BY MOUTH TWICE A DAY 30 MINUTES BEFORE MEALS   rosuvastatin (CRESTOR) 20 MG tablet Take 20 mg by mouth daily.   No current facility-administered medications on file prior to visit.    Allergies:  Allergies  Allergen Reactions   Codeine     Social History:  Social History   Socioeconomic History   Marital status: Widowed    Spouse name: Not on file   Number of children: Not on file   Years of education: Not on file   Highest education level: Not on file  Occupational History   Not on file  Tobacco Use   Smoking status: Former    Types: Cigarettes    Quit date: 10/13/1993    Years since quitting: 29.0   Smokeless tobacco: Never  Vaping Use   Vaping Use: Never used  Substance and Sexual Activity   Alcohol use: Not Currently   Drug use: No   Sexual activity: Not Currently  Other Topics Concern   Not on file  Social History Narrative   Not on file   Social Determinants of Health   Financial Resource Strain: Not on file  Food Insecurity: Not on file  Transportation Needs: Not on file  Physical Activity: Not on file  Stress: Not on file  Social Connections: Not on file  Intimate Partner Violence: Not on file   Social History   Tobacco Use  Smoking Status Former   Types: Cigarettes   Quit date: 10/13/1993   Years since quitting: 29.0  Smokeless Tobacco Never   Social History   Substance and Sexual Activity  Alcohol Use Not Currently    Family History:  Family History  Problem Relation Age of Onset   Breast cancer Maternal Aunt 9    Past medical history, surgical history, medications, allergies, family history and social history reviewed with patient today and changes made to appropriate areas of the chart.   Review of Systems  HENT:         Denies vision changes.  Eyes:  Negative for  blurred vision and double vision.  Respiratory:  Negative for shortness of breath.   Cardiovascular:  Negative for chest pain, palpitations and leg swelling.  Neurological:  Negative for dizziness, tingling and headaches.  Endo/Heme/Allergies:  Negative for polydipsia.       Denies Polyuria    All other ROS negative except what is listed above and in the HPI.      Objective:    BP 118/75 (BP Location: Left Arm, Cuff Size: Normal)   Pulse 67   Temp 98.8 F (37.1 C) (Oral)   Wt 167 lb 11.2 oz (76.1 kg)   SpO2 96%   BMI 27.28 kg/m   Wt Readings from Last 3 Encounters:  10/30/22 167 lb 11.2  oz (76.1 kg)  07/30/22 164 lb 3.2 oz (74.5 kg)  12/04/15 166 lb (75.3 kg)    No results found.  Physical Exam Vitals and nursing note reviewed.  Constitutional:      General: She is awake. She is not in acute distress.    Appearance: She is well-developed. She is not ill-appearing.  HENT:     Head: Normocephalic and atraumatic.     Right Ear: Hearing, tympanic membrane, ear canal and external ear normal. No drainage.     Left Ear: Hearing, tympanic membrane, ear canal and external ear normal. No drainage.     Nose: Nose normal.     Right Sinus: No maxillary sinus tenderness or frontal sinus tenderness.     Left Sinus: No maxillary sinus tenderness or frontal sinus tenderness.     Mouth/Throat:     Mouth: Mucous membranes are moist.     Pharynx: Oropharynx is clear. Uvula midline. No pharyngeal swelling, oropharyngeal exudate or posterior oropharyngeal erythema.  Eyes:     General: Lids are normal.        Right eye: No discharge.        Left eye: No discharge.     Extraocular Movements: Extraocular movements intact.     Conjunctiva/sclera: Conjunctivae normal.     Pupils: Pupils are equal, round, and reactive to light.     Visual Fields: Right eye visual fields normal and left eye visual fields normal.  Neck:     Thyroid: No thyromegaly.     Vascular: No carotid bruit.     Trachea:  Trachea normal.  Cardiovascular:     Rate and Rhythm: Normal rate and regular rhythm.     Heart sounds: Normal heart sounds. No murmur heard.    No gallop.  Pulmonary:     Effort: Pulmonary effort is normal. No accessory muscle usage or respiratory distress.     Breath sounds: Normal breath sounds.  Chest:  Breasts:    Right: Normal.     Left: Normal.  Abdominal:     General: Bowel sounds are normal.     Palpations: Abdomen is soft. There is no hepatomegaly or splenomegaly.     Tenderness: There is no abdominal tenderness.  Musculoskeletal:        General: Normal range of motion.     Cervical back: Normal range of motion and neck supple.     Right lower leg: No edema.     Left lower leg: No edema.  Lymphadenopathy:     Head:     Right side of head: No submental, submandibular, tonsillar, preauricular or posterior auricular adenopathy.     Left side of head: No submental, submandibular, tonsillar, preauricular or posterior auricular adenopathy.     Cervical: No cervical adenopathy.     Upper Body:     Right upper body: No supraclavicular, axillary or pectoral adenopathy.     Left upper body: No supraclavicular, axillary or pectoral adenopathy.  Skin:    General: Skin is warm and dry.     Capillary Refill: Capillary refill takes less than 2 seconds.     Findings: No rash.  Neurological:     Mental Status: She is alert and oriented to person, place, and time.     Gait: Gait is intact.  Psychiatric:        Attention and Perception: Attention normal.        Mood and Affect: Mood normal.        Speech: Speech  normal.        Behavior: Behavior normal. Behavior is cooperative.        Thought Content: Thought content normal.        Judgment: Judgment normal.         No data to display          Cognitive Testing - 6-CIT  Correct? Score   What year is it? yes 0 Yes = 0    No = 4  What month is it? yes 0 Yes = 0    No = 3  Remember:     Pia Mau, [place], [place]      What time is it? yes 0 Yes = 0    No = 3  Count backwards from 20 to 1 yes 0 Correct = 0    1 error = 2   More than 1 error = 4  Say the months of the year in reverse. yes 0 Correct = 0    1 error = 2   More than 1 error = 4  What address did I ask you to remember? yes 2 Correct = 0  1 error = 2    2 error = 4    3 error = 6    4 error = 8    All wrong = 10       TOTAL SCORE  2/28   Interpretation:  Normal  Normal (0-7) Abnormal (8-28)   Results for orders placed or performed in visit on 09/20/22  HM DIABETES EYE EXAM  Result Value Ref Range   HM Diabetic Eye Exam No Retinopathy No Retinopathy      Assessment & Plan:   Problem List Items Addressed This Visit       Cardiovascular and Mediastinum   Benign essential hypertension   Relevant Medications   lisinopril (ZESTRIL) 2.5 MG tablet   Other Relevant Orders   Comp Met (CMET)     Endocrine   Diabetes mellitus type 2, controlled, without complications (HCC) - Primary   Relevant Medications   lisinopril (ZESTRIL) 2.5 MG tablet   Other Relevant Orders   Comp Met (CMET)   HgB A1c   Urine Microalbumin w/creat. ratio     Musculoskeletal and Integument   Osteoporosis     Other   Mixed hyperlipidemia   Relevant Medications   lisinopril (ZESTRIL) 2.5 MG tablet   Other Relevant Orders   Lipid Profile   Other Visit Diagnoses     Encounter for annual wellness exam in Medicare patient       Advanced care planning/counseling discussion            Preventative Services:  AAA screening: NA Health Risk Assessment and Personalized Prevention Plan: Up to date Bone Mass Measurements: Up to date Breast Cancer Screening: Up to date CVD Screening: Up to date Cervical Cancer Screening: NA Colon Cancer Screening: Up to date Depression Screening: Up to date Diabetes Screening: Up to date Glaucoma Screening: Up to date Hepatitis B vaccine: NA Hepatitis C screening: Up to date HIV Screening: Up to date Flu Vaccine: Up to  date Lung cancer Screening: NA Obesity Screening: Up to date Pneumonia Vaccines (2): Up to date STI Screening: NA  Follow up plan: Return in about 6 months (around 04/30/2023) for HTN, HLD, DM2 FU.   LABORATORY TESTING:  - Pap smear: not applicable  IMMUNIZATIONS:   - Tdap: Tetanus vaccination status reviewed: last tetanus booster within 10 years. -  Influenza: Up to date - Pneumovax: Up to date - Prevnar: Up to date - Zostavax vaccine: Up to date  SCREENING: -Mammogram: Up to date  - Colonoscopy: Up to date  - Bone Density: Up to date  -Hearing Test: Not applicable  -Spirometry: Not applicable   PATIENT COUNSELING:   Advised to take 1 mg of folate supplement per day if capable of pregnancy.   Sexuality: Discussed sexually transmitted diseases, partner selection, use of condoms, avoidance of unintended pregnancy  and contraceptive alternatives.   Advised to avoid cigarette smoking.  I discussed with the patient that most people either abstain from alcohol or drink within safe limits (<=14/week and <=4 drinks/occasion for males, <=7/weeks and <= 3 drinks/occasion for females) and that the risk for alcohol disorders and other health effects rises proportionally with the number of drinks per week and how often a drinker exceeds daily limits.  Discussed cessation/primary prevention of drug use and availability of treatment for abuse.   Diet: Encouraged to adjust caloric intake to maintain  or achieve ideal body weight, to reduce intake of dietary saturated fat and total fat, to limit sodium intake by avoiding high sodium foods and not adding table salt, and to maintain adequate dietary potassium and calcium preferably from fresh fruits, vegetables, and low-fat dairy products.    stressed the importance of regular exercise  Injury prevention: Discussed safety belts, safety helmets, smoke detector, smoking near bedding or upholstery.   Dental health: Discussed importance of regular  tooth brushing, flossing, and dental visits.    NEXT PREVENTATIVE PHYSICAL DUE IN 1 YEAR. Return in about 6 months (around 04/30/2023) for HTN, HLD, DM2 FU.

## 2022-10-30 NOTE — Assessment & Plan Note (Signed)
Chronic.  Controlled.  Will start Lisinopril 2.'5mg'$  daily for kidney protection.  Side effects and benefits of medication discussed during visit.  Labs ordered today.  Return to clinic in 6 months for reevaluation.  Call sooner if concerns arise.

## 2022-10-31 LAB — MICROALBUMIN / CREATININE URINE RATIO

## 2022-10-31 LAB — LIPID PANEL
Chol/HDL Ratio: 2.4 ratio (ref 0.0–4.4)
Cholesterol, Total: 124 mg/dL (ref 100–199)
HDL: 52 mg/dL (ref >39)
LDL Chol Calc (NIH): 54 mg/dL (ref 0–99)

## 2022-10-31 LAB — COMPREHENSIVE METABOLIC PANEL
ALT: 15 IU/L (ref 0–32)
BUN/Creatinine Ratio: 14 (ref 12–28)
BUN: 12 mg/dL (ref 8–27)
Bilirubin Total: 0.5 mg/dL (ref 0.0–1.2)
Calcium: 9.5 mg/dL (ref 8.7–10.3)
Potassium: 4.4 mmol/L (ref 3.5–5.2)

## 2022-10-31 LAB — HEMOGLOBIN A1C: Est. average glucose Bld gHb Est-mCnc: 134 mg/dL

## 2022-10-31 NOTE — Progress Notes (Signed)
Please let patient know that her lab work looks great.  Her A1c is well controlled at 6.3.  Liver, kidneys and electrolytes look great.  No concerns at this time.  Continue with current medication regimen.  Follow up as discussed.

## 2022-11-01 LAB — LIPID PANEL
Triglycerides: 97 mg/dL (ref 0–149)
VLDL Cholesterol Cal: 18 mg/dL (ref 5–40)

## 2022-11-01 LAB — COMPREHENSIVE METABOLIC PANEL
AST: 18 IU/L (ref 0–40)
Albumin/Globulin Ratio: 1.7 (ref 1.2–2.2)
Albumin: 4.3 g/dL (ref 3.8–4.8)
Alkaline Phosphatase: 82 IU/L (ref 44–121)
CO2: 25 mmol/L (ref 20–29)
Chloride: 103 mmol/L (ref 96–106)
Creatinine, Ser: 0.87 mg/dL (ref 0.57–1.00)
Globulin, Total: 2.6 g/dL (ref 1.5–4.5)
Glucose: 107 mg/dL — ABNORMAL HIGH (ref 70–99)
Sodium: 141 mmol/L (ref 134–144)
Total Protein: 6.9 g/dL (ref 6.0–8.5)
eGFR: 70 mL/min/{1.73_m2} (ref >59)

## 2022-11-01 LAB — HEMOGLOBIN A1C: Hgb A1c MFr Bld: 6.3 % — ABNORMAL HIGH (ref 4.8–5.6)

## 2023-01-01 ENCOUNTER — Encounter: Payer: Self-pay | Admitting: Internal Medicine

## 2023-01-08 ENCOUNTER — Ambulatory Visit
Admission: RE | Admit: 2023-01-08 | Discharge: 2023-01-08 | Disposition: A | Discharge status: Home / Self Care | Payer: Medicare Other | Attending: Internal Medicine | Admitting: Internal Medicine

## 2023-01-08 ENCOUNTER — Ambulatory Visit: Payer: Medicare Other | Admitting: Certified Registered"

## 2023-01-08 ENCOUNTER — Encounter
Admission: RE | Disposition: A | Discharge status: Home / Self Care | Payer: Self-pay | Source: Home / Self Care | Attending: Internal Medicine

## 2023-01-08 ENCOUNTER — Encounter: Payer: Self-pay | Admitting: Internal Medicine

## 2023-01-08 ENCOUNTER — Other Ambulatory Visit: Payer: Self-pay

## 2023-01-08 DIAGNOSIS — Z86010 Personal history of colonic polyps: Secondary | ICD-10-CM | POA: Insufficient documentation

## 2023-01-08 DIAGNOSIS — K5730 Diverticulosis of large intestine without perforation or abscess without bleeding: Secondary | ICD-10-CM | POA: Insufficient documentation

## 2023-01-08 DIAGNOSIS — Z79899 Other long term (current) drug therapy: Secondary | ICD-10-CM | POA: Insufficient documentation

## 2023-01-08 DIAGNOSIS — Z85820 Personal history of malignant melanoma of skin: Secondary | ICD-10-CM | POA: Diagnosis not present

## 2023-01-08 DIAGNOSIS — E785 Hyperlipidemia, unspecified: Secondary | ICD-10-CM | POA: Insufficient documentation

## 2023-01-08 DIAGNOSIS — K219 Gastro-esophageal reflux disease without esophagitis: Secondary | ICD-10-CM | POA: Diagnosis not present

## 2023-01-08 DIAGNOSIS — K640 First degree hemorrhoids: Secondary | ICD-10-CM | POA: Diagnosis not present

## 2023-01-08 DIAGNOSIS — R1310 Dysphagia, unspecified: Secondary | ICD-10-CM | POA: Insufficient documentation

## 2023-01-08 DIAGNOSIS — Z853 Personal history of malignant neoplasm of breast: Secondary | ICD-10-CM | POA: Diagnosis not present

## 2023-01-08 DIAGNOSIS — E119 Type 2 diabetes mellitus without complications: Secondary | ICD-10-CM | POA: Insufficient documentation

## 2023-01-08 DIAGNOSIS — Z7984 Long term (current) use of oral hypoglycemic drugs: Secondary | ICD-10-CM | POA: Diagnosis not present

## 2023-01-08 DIAGNOSIS — K2100 Gastro-esophageal reflux disease with esophagitis, without bleeding: Secondary | ICD-10-CM | POA: Insufficient documentation

## 2023-01-08 DIAGNOSIS — K2970 Gastritis, unspecified, without bleeding: Secondary | ICD-10-CM | POA: Insufficient documentation

## 2023-01-08 DIAGNOSIS — Z1211 Encounter for screening for malignant neoplasm of colon: Secondary | ICD-10-CM | POA: Diagnosis not present

## 2023-01-08 HISTORY — PX: COLONOSCOPY WITH PROPOFOL: SHX5780

## 2023-01-08 HISTORY — PX: ESOPHAGOGASTRODUODENOSCOPY (EGD) WITH PROPOFOL: SHX5813

## 2023-01-08 LAB — GLUCOSE, CAPILLARY: Glucose-Capillary: 139 mg/dL — ABNORMAL HIGH (ref 70–99)

## 2023-01-08 SURGERY — COLONOSCOPY WITH PROPOFOL
Anesthesia: General

## 2023-01-08 MED ORDER — LIDOCAINE HCL (CARDIAC) PF 100 MG/5ML IV SOSY
PREFILLED_SYRINGE | INTRAVENOUS | Status: DC | PRN
  Administered 2023-01-08: 100 mg via INTRAVENOUS

## 2023-01-08 MED ORDER — DEXMEDETOMIDINE HCL IN NACL 80 MCG/20ML IV SOLN
INTRAVENOUS | Status: DC | PRN
  Administered 2023-01-08: 8 ug via BUCCAL

## 2023-01-08 MED ORDER — PROPOFOL 1000 MG/100ML IV EMUL
INTRAVENOUS | Status: AC
  Filled 2023-01-08: qty 100

## 2023-01-08 MED ORDER — LIDOCAINE HCL (PF) 2 % IJ SOLN
INTRAMUSCULAR | Status: AC
  Filled 2023-01-08: qty 35

## 2023-01-08 MED ORDER — SODIUM CHLORIDE 0.9 % IV SOLN: INTRAVENOUS | Status: DC

## 2023-01-08 MED ORDER — PROPOFOL 500 MG/50ML IV EMUL
INTRAVENOUS | Status: DC | PRN
  Administered 2023-01-08: 50 mg via INTRAVENOUS
  Administered 2023-01-08: 150 ug/kg/min via INTRAVENOUS

## 2023-01-08 NOTE — Transfer of Care (Signed)
Immediate Anesthesia Transfer of Care Note  Patient: Phyllis Hall  Procedure(s) Performed: COLONOSCOPY WITH PROPOFOL ESOPHAGOGASTRODUODENOSCOPY (EGD) WITH PROPOFOL  Patient Location: PACU  Anesthesia Type:General  Level of Consciousness: drowsy  Airway & Oxygen Therapy: Patient Spontanous Breathing  Post-op Assessment: Report given to RN and Post -op Vital signs reviewed and stable  Post vital signs: stable  Last Vitals:  Vitals Value Taken Time  BP    Temp    Pulse 79 01/08/23 0823  Resp 16 01/08/23 0823  SpO2 92 % 01/08/23 0823  Vitals shown include unvalidated device data.  Last Pain:  Vitals:   01/08/23 0733  TempSrc: Temporal  PainSc: 0-No pain         Complications: No notable events documented.

## 2023-01-08 NOTE — Anesthesia Preprocedure Evaluation (Signed)
Anesthesia Evaluation  Patient identified by MRN, date of birth, ID band Patient awake    Reviewed: Allergy & Precautions, NPO status , Patient's Chart, lab work & pertinent test results  Airway Mallampati: II  TM Distance: >3 FB Neck ROM: Full    Dental  (+) Edentulous Upper, Edentulous Lower   Pulmonary neg pulmonary ROS, Patient abstained from smoking., former smoker   Pulmonary exam normal  + decreased breath sounds      Cardiovascular Exercise Tolerance: Good hypertension, Pt. on medications negative cardio ROS Normal cardiovascular exam Rhythm:Regular Rate:Normal     Neuro/Psych negative neurological ROS  negative psych ROS   GI/Hepatic negative GI ROS, Neg liver ROS,GERD  Medicated,,  Endo/Other  negative endocrine ROSdiabetes, Type 2, Oral Hypoglycemic Agents    Renal/GU negative Renal ROS  negative genitourinary   Musculoskeletal negative musculoskeletal ROS (+)    Abdominal Normal abdominal exam  (+)   Peds negative pediatric ROS (+)  Hematology negative hematology ROS (+)   Anesthesia Other Findings Past Medical History: 2006: Breast cancer     Comment:  right breast with lumpectomy and rad tx No date: Diabetes mellitus without complication No date: GERD (gastroesophageal reflux disease) No date: Hyperlipidemia No date: Melanoma  Past Surgical History: 2006: BREAST BIOPSY; Right     Comment:  Breast Cancer No date: BREAST LUMPECTOMY 12/04/2015: COLONOSCOPY WITH PROPOFOL; N/A     Comment:  Procedure: COLONOSCOPY WITH PROPOFOL;  Surgeon: Elnita Maxwell, MD;  Location: [facility] ENDOSCOPY;  Service:               Endoscopy;  Laterality: N/A; No date: MASTECTOMY, PARTIAL  BMI    Body Mass Index: 26.51 kg/m      Reproductive/Obstetrics negative OB ROS                             Anesthesia Physical Anesthesia Plan  ASA: 3  Anesthesia Plan: General    Post-op Pain Management:    Induction: Intravenous  PONV Risk Score and Plan: Propofol infusion and TIVA  Airway Management Planned: Natural Airway  Additional Equipment:   Intra-op Plan:   Post-operative Plan:   Informed Consent: I have reviewed the patients History and Physical, chart, labs and discussed the procedure including the risks, benefits and alternatives for the proposed anesthesia with the patient or authorized representative who has indicated his/her understanding and acceptance.     Dental Advisory Given  Plan Discussed with: CRNA and Surgeon  Anesthesia Plan Comments:        Anesthesia Quick Evaluation

## 2023-01-08 NOTE — Interval H&P Note (Signed)
History and Physical Interval Note:  01/08/2023 7:44 AM  Phyllis Hall  has presented today for surgery, with the diagnosis of DYSPHAGIA,GERD,PERSONAL HX OF COLON POLYP.  The various methods of treatment have been discussed with the patient and family. After consideration of risks, benefits and other options for treatment, the patient has consented to  Procedure(s) with comments: COLONOSCOPY WITH PROPOFOL (N/A) - DM ESOPHAGOGASTRODUODENOSCOPY (EGD) WITH PROPOFOL (N/A) as a surgical intervention.  The patient's history has been reviewed, patient examined, no change in status, stable for surgery.  I have reviewed the patient's chart and labs.  Questions were answered to the patient's satisfaction.     [place], [place]

## 2023-01-08 NOTE — Op Note (Signed)
[facility] Patient Name: Phyllis Hall Procedure Date: 01/08/2023 7:54 AM MRN: [id number] Account #: [id number] Date of Birth: 1950-12-13 Admit Type: Outpatient Age: 72 Room: [facility] 2 Gender: Female Note Status: Finalized Instrument Name: Patton Salles Endoscope [id number] Procedure:             Upper GI endoscopy Indications:           Dysphagia, Gastro-esophageal reflux disease Providers:             Boykin Nearing. Norma Fredrickson MD, MD Referring MD:          Larae Grooms (Referring MD) Medicines:             Propofol per Anesthesia Complications:         No immediate complications. Procedure:             Pre-Anesthesia Assessment:                        - The risks and benefits of the procedure and the                         sedation options and risks were discussed with the                         patient. All questions were answered and informed                         consent was obtained.                        - Patient identification and proposed procedure were                         verified prior to the procedure by the nurse. The                         procedure was verified in the procedure room.                        - ASA Grade Assessment: III - A patient with severe                         systemic disease.                        - After reviewing the risks and benefits, the patient                         was deemed in satisfactory condition to undergo the                         procedure.                        After obtaining informed consent, the endoscope was                         passed under direct vision. Throughout the procedure,  the patient's blood pressure, pulse, and oxygen                         saturations were monitored continuously. The Endoscope                         was introduced through the mouth, and advanced to the                         third part of duodenum. The upper GI endoscopy  was                         accomplished without difficulty. The patient tolerated                         the procedure well. Findings:      No endoscopic abnormality was evident in the esophagus to explain the       patient's complaint of dysphagia. It was decided, however, to proceed       with dilation in the distal esophagus. The scope was withdrawn. Dilation       was performed with a Maloney dilator with mild resistance at 54 Fr.      Normal mucosa was found in the entire esophagus.      Patchy minimal inflammation characterized by erythema was found in the       entire examined stomach.      The exam of the stomach was otherwise normal.      The examined duodenum was normal.      The exam was otherwise without abnormality. Impression:            - No endoscopic esophageal abnormality to explain                         patient's dysphagia. Esophagus dilated. Dilated.                        - Normal mucosa was found in the entire esophagus.                        - Gastritis.                        - Normal examined duodenum.                        - The examination was otherwise normal.                        - No specimens collected. Recommendation:        - Continue present medications.                        - Encourage daily use of PPI (Omeprazole,                         Pantoprazole, etc.).                        - Proceed with colonoscopy Procedure Code(s):     --- Professional ---  16109, Esophagogastroduodenoscopy, flexible,                         transoral; diagnostic, including collection of                         specimen(s) by brushing or washing, when performed                         (separate procedure)                        43450, Dilation of esophagus, by unguided sound or                         bougie, single or multiple passes Diagnosis Code(s):     --- Professional ---                        K21.9, Gastro-esophageal reflux disease  without                         esophagitis                        K29.70, Gastritis, unspecified, without bleeding                        R13.10, Dysphagia, unspecified CPT copyright 2022 American Medical Association. All rights reserved. The codes documented in this report are preliminary and upon coder review may  be revised to meet current compliance requirements. [name] MD, MD 01/08/2023 8:05:22 AM This report has been signed electronically. Number of Addenda: 0 Note Initiated On: 01/08/2023 7:54 AM Estimated Blood Loss:  Estimated blood loss: none.      Phyllis Hall

## 2023-01-08 NOTE — H&P (Signed)
Outpatient short stay form Pre-procedure 01/08/2023 7:42 AM Phyllis Hall K. Norma Fredrickson, M.D.  Primary Physician: Larae Grooms, NP  Reason for visit:  Dysphagia, personal history of Sessile serrated adenoma of the colon, 2017.  History of present illness:  72 y/o female. She has occasional dysphagia to bread, she notes this has been occurring more often over the past few years, is most notable to cornbread. She does not eat a lot of meats. She separates taking her pills and takes them 1 at a time to prevent dysphagia, she previously took pills altogether. She does have occasional GERD symptoms but overall these are fairly infrequent. She denies NSAID mainly takes Tylenol. Denies alcohol & tobacco.  Previous GI testing: EGD 11/2015--normal esophagus, stomach, duodenum Colonoscopy 2017-one 4mm polyp ascending, 3mm polyp rectosigmoid. Path w/ 1 SSA. 5 year repeat     Current Facility-Administered Medications:    0.9 %  sodium chloride infusion, , Intravenous, Continuous, Honor Fairbank, Boykin Nearing, MD  Medications Prior to Admission  Medication Sig Dispense Refill Last Dose   ascorbic acid (VITAMIN C) 500 MG tablet Take 500 mg by mouth daily.   01/07/2023   aspirin 81 MG tablet Take 81 mg by mouth daily.   01/07/2023   calcium carbonate (OSCAL) 1500 (600 Ca) MG TABS tablet Take 600 mg of elemental calcium by mouth 2 (two) times daily with a meal.   01/07/2023   cholecalciferol (VITAMIN D3) 25 MCG (1000 UNIT) tablet Take 1,000 Units by mouth daily.   01/07/2023   fluticasone (FLONASE) 50 MCG/ACT nasal spray Place into both nostrils daily.   01/07/2023   JANUMET 50-1000 MG tablet Take 1 tablet by mouth 2 (two) times daily with a meal.   01/07/2023   lisinopril (ZESTRIL) 2.5 MG tablet Take 1 tablet (2.5 mg total) by mouth daily. 90 tablet 1 01/07/2023   montelukast (SINGULAIR) 10 MG tablet Take 10 mg by mouth at bedtime.   01/07/2023   Multiple Vitamin (MULTIVITAMIN WITH MINERALS) TABS tablet Take 1 tablet by mouth  daily.   01/07/2023   pantoprazole (PROTONIX) 40 MG tablet TAKE 1 TABLET BY MOUTH TWICE A DAY 30 MINUTES BEFORE MEALS   01/07/2023   rosuvastatin (CRESTOR) 20 MG tablet Take 20 mg by mouth daily.   01/07/2023   alendronate (FOSAMAX) 70 MG tablet Take 70 mg by mouth once a week.      amitriptyline (ELAVIL) 10 MG tablet TAKE 2 TABLETS BY MOUTH EVERY NIGHT AT BEDTIME (Patient not taking: Reported on 01/01/2023) 90 tablet 3 Not Taking     Allergies  Allergen Reactions   Codeine Nausea And Vomiting     Past Medical History:  Diagnosis Date   Breast cancer 2006   right breast with lumpectomy and rad tx   Diabetes mellitus without complication    GERD (gastroesophageal reflux disease)    Hyperlipidemia    Melanoma     Review of systems:  Otherwise negative.    Physical Exam  Gen: Alert, oriented. Appears stated age.  HEENT: [place]/AT. PERRLA. Lungs: CTA, no wheezes. CV: RR nl S1, S2. Abd: soft, benign, no masses. BS+ Ext: No edema. Pulses 2+    Planned procedures: Proceed with EGD and colonoscopy. The patient understands the nature of the planned procedure, indications, risks, alternatives and potential complications including but not limited to bleeding, infection, perforation, damage to internal organs and possible oversedation/side effects from anesthesia. The patient agrees and gives consent to proceed.  Please refer to procedure notes for findings, recommendations and patient  disposition/instructions.     Phyllis Hall K. Norma Fredrickson, M.D. Gastroenterology 01/08/2023  7:42 AM

## 2023-01-08 NOTE — Anesthesia Postprocedure Evaluation (Signed)
Anesthesia Post Note  Patient: Phyllis Hall  Procedure(s) Performed: COLONOSCOPY WITH PROPOFOL ESOPHAGOGASTRODUODENOSCOPY (EGD) WITH PROPOFOL  Patient location during evaluation: PACU Anesthesia Type: General Level of consciousness: awake and oriented Pain management: satisfactory to patient Vital Signs Assessment: post-procedure vital signs reviewed and stable Respiratory status: spontaneous breathing and respiratory function stable Cardiovascular status: stable Anesthetic complications: no   There were no known notable events for this encounter.   Last Vitals:  Vitals:   01/08/23 0833 01/08/23 0844  BP: 95/79 121/78  Pulse:  67  Resp: 20 15  Temp:    SpO2: 97% 99%    Last Pain:  Vitals:   01/08/23 0844  TempSrc:   PainSc: 2                  VAN STAVEREN,Lorieann Argueta

## 2023-01-08 NOTE — Op Note (Signed)
[facility] Gastroenterology Patient Name: Phyllis Hall Procedure Date: 01/08/2023 7:53 AM MRN: [id number] Account #: [id number] Date of Birth: 07-08-1951 Admit Type: Outpatient Age: 72 Room: [facility] ENDO ROOM 2 Gender: Female Note Status: Finalized Instrument Name: Prentice Docker [id number] Procedure:             Colonoscopy Indications:           High risk colon cancer surveillance: Personal history                         of traditional serrated adenoma of the colon Providers:             Boykin Nearing. Norma Fredrickson MD, MD Referring MD:          Larae Grooms (Referring MD) Medicines:             Propofol per Anesthesia Complications:         No immediate complications. Procedure:             Pre-Anesthesia Assessment:                        - The risks and benefits of the procedure and the                         sedation options and risks were discussed with the                         patient. All questions were answered and informed                         consent was obtained.                        - Patient identification and proposed procedure were                         verified prior to the procedure by the nurse. The                         procedure was verified in the procedure room.                        - ASA Grade Assessment: III - A patient with severe                         systemic disease.                        - After reviewing the risks and benefits, the patient                         was deemed in satisfactory condition to undergo the                         procedure.                        After obtaining informed consent, the colonoscope was  passed under direct vision. Throughout the procedure,                         the patient's blood pressure, pulse, and oxygen                         saturations were monitored continuously. The                         Colonoscope was introduced through the anus and                          advanced to the the cecum, identified by appendiceal                         orifice and ileocecal valve. The colonoscopy was                         performed without difficulty. The patient tolerated                         the procedure well. The quality of the bowel                         preparation was good. The ileocecal valve, appendiceal                         orifice, and rectum were photographed. Findings:      The perianal and digital rectal examinations were normal. Pertinent       negatives include normal sphincter tone and no palpable rectal lesions.      Non-bleeding internal hemorrhoids were found during retroflexion. The       hemorrhoids were Grade I (internal hemorrhoids that do not prolapse).      Multiple medium-mouthed and small-mouthed diverticula were found in the       sigmoid colon. There was no evidence of diverticular bleeding.      The exam was otherwise without abnormality. Impression:            - Non-bleeding internal hemorrhoids.                        - Moderate diverticulosis in the sigmoid colon. There                         was no evidence of diverticular bleeding.                        - The examination was otherwise normal.                        - No specimens collected. Recommendation:        - Patient has a contact number available for                         emergencies. The signs and symptoms of potential                         delayed complications were discussed with the patient.  Return to normal activities tomorrow. Written                         discharge instructions were provided to the patient.                        - Resume previous diet.                        - Continue present medications.                        - No repeat colonoscopy due to current age (49 years                         or older) and the absence of colonic polyps.                        - Monitor results to esophageal dilation                         - Follow up with Tawni Pummel, PA-C at [facility]enter[facility]. [phone].                        - Telephone GI office to schedule appointment in 3                         months.                        - Take your proton pump inhibitor (Protonix, Prilosec,                         Nexium, etc.) every morning before breakfast.                        - You do NOT require further colon cancer screening                         measures (Annual stool testing (i.e. hemoccult, FIT,                         cologuard), sigmoidoscopy, colonoscopy or CT                         colonography). You should share this recommendation                         with your Primary Care provider.                        - The findings and recommendations were discussed with                         the patient. Procedure Code(s):     --- Professional ---  G0105, Colorectal cancer screening; colonoscopy on                         individual at high risk Diagnosis Code(s):     --- Professional ---                        K57.30, Diverticulosis of large intestine without                         perforation or abscess without bleeding                        K64.0, First degree hemorrhoids                        Z86.010, Personal history of colonic polyps CPT copyright 2022 American Medical Association. All rights reserved. The codes documented in this report are preliminary and upon coder review may  be revised to meet current compliance requirements. [name] MD, MD 01/08/2023 8:23:10 AM This report has been signed electronically. Number of Addenda: 0 Note Initiated On: 01/08/2023 7:53 AM Scope Withdrawal Time: 0 hours 6 minutes 23 seconds  Total Procedure Duration: 0 hours 12 minutes 14 seconds  Estimated Blood Loss:  Estimated blood loss: none.      [facility]

## 2023-01-09 ENCOUNTER — Encounter: Payer: Self-pay | Admitting: Internal Medicine

## 2023-02-27 ENCOUNTER — Encounter: Payer: Self-pay | Admitting: Physician Assistant

## 2023-02-27 ENCOUNTER — Ambulatory Visit (INDEPENDENT_AMBULATORY_CARE_PROVIDER_SITE_OTHER): Payer: Medicare Other | Admitting: Physician Assistant

## 2023-02-27 VITALS — BP 126/70 | HR 81 | Temp 98.3°F

## 2023-02-27 DIAGNOSIS — M25551 Pain in right hip: Secondary | ICD-10-CM

## 2023-02-27 NOTE — Progress Notes (Signed)
Acute Office Visit   Patient: Phyllis Hall   DOB: 26-Dec-1950   72 y.o. Female  MRN: [id number] Visit Date: 02/27/2023  Today's healthcare provider: Oswaldo Conroy Travez Stancil, PA-C  Introduced myself to the patient as a Secondary school teacher and provided education on APPs in clinical practice.    Chief Complaint  Patient presents with   Leg Pain    Pt states she has been having R leg pain since last week. States she went to [facility] last week, given Celebrex, states it did not work. States x-rays and stuff were done and was told she had arthritis. States she was still in a lot of pain so she went back to [facility] on Monday, given Tramadol, states this does help some but states she is still in constant pain. Patient states she is concerned she either has a blood clot, or bone cancer. States her brother had bone cancer.    Subjective    HPI HPI     Leg Pain    Additional comments: Pt states she has been having R leg pain since last week. States she went to [facility] last week, given Celebrex, states it did not work. States x-rays and stuff were done and was told she had arthritis. States she was still in a lot of pain so she went back to [facility] on Monday, given Tramadol, states this does help some but states she is still in constant pain. Patient states she is concerned she either has a blood clot, or bone cancer. States her brother had bone cancer.       Last edited by Pablo Ledger, CMA on 02/27/2023  2:31 PM.      She reports she is still having difficulty with pain in her right leg and hip She reports she has not heard anything back from [facility] about pain management referral or hip injection scheduling and would like to proceed with either of these options She reports difficulty walking due to pain  She reports pain is like an ache and has been almost unmanageable since Sat  June 1st  She was seen again on 02/24/23 and given a shoulder injection  She reports a family hx of  bone cancer in her brother  She reports a numb and tingling sensation from right hip down thigh and into right shin area  She states pain feels a bit better while sitting upright and she is having to sleep while sitting up due to pain   Medications: Outpatient Medications Prior to Visit  Medication Sig   alendronate (FOSAMAX) 70 MG tablet Take 70 mg by mouth once a week.   ascorbic acid (VITAMIN C) 500 MG tablet Take 500 mg by mouth daily.   aspirin 81 MG tablet Take 81 mg by mouth daily.   calcium carbonate (OSCAL) 1500 (600 Ca) MG TABS tablet Take 600 mg of elemental calcium by mouth 2 (two) times daily with a meal.   cholecalciferol (VITAMIN D3) 25 MCG (1000 UNIT) tablet Take 1,000 Units by mouth daily.   fluticasone (FLONASE) 50 MCG/ACT nasal spray Place into both nostrils daily.   JANUMET 50-1000 MG tablet Take 1 tablet by mouth 2 (two) times daily with a meal.   lisinopril (ZESTRIL) 2.5 MG tablet Take 1 tablet (2.5 mg total) by mouth daily.   montelukast (SINGULAIR) 10 MG tablet Take 10 mg by mouth at bedtime.   Multiple Vitamin (MULTIVITAMIN WITH MINERALS) TABS tablet Take  1 tablet by mouth daily.   pantoprazole (PROTONIX) 40 MG tablet TAKE 1 TABLET BY MOUTH TWICE A DAY 30 MINUTES BEFORE MEALS   rosuvastatin (CRESTOR) 20 MG tablet Take 20 mg by mouth daily.   traMADol (ULTRAM) 50 MG tablet Take 50 mg by mouth every 6 (six) hours as needed.   amitriptyline (ELAVIL) 10 MG tablet TAKE 2 TABLETS BY MOUTH EVERY NIGHT AT BEDTIME (Patient not taking: Reported on 01/01/2023)   No facility-administered medications prior to visit.    Review of Systems  Musculoskeletal:  Positive for arthralgias and myalgias.       Objective    BP 126/70   Pulse 81   Temp 98.3 F (36.8 C) (Oral)   SpO2 96%    Physical Exam Vitals reviewed.  Constitutional:      General: She is awake.     Appearance: Normal appearance. She is well-developed and well-groomed.  HENT:     Head: Normocephalic and  atraumatic.  Cardiovascular:     Pulses:          Dorsalis pedis pulses are 2+ on the right side and 2+ on the left side.     Comments: Calves are symmetrical in size, nontender No erythema, swelling or hardness of calves or thighs  Pulmonary:     Effort: Pulmonary effort is normal.  Musculoskeletal:     Right upper leg: No swelling or edema.     Left upper leg: No swelling or edema.     Right knee: No swelling, deformity or erythema. Normal range of motion.     Left knee: No swelling, deformity or erythema. Normal range of motion.     Right lower leg: No edema.     Left lower leg: No edema.  Neurological:     General: No focal deficit present.     Mental Status: She is alert and oriented to person, place, and time.     GCS: GCS eye subscore is 4. GCS verbal subscore is 5. GCS motor subscore is 6.     Cranial Nerves: No dysarthria or facial asymmetry.     Motor: Motor function is intact.  Psychiatric:        Behavior: Behavior is cooperative.       No results found for any visits on 02/27/23.  Assessment & Plan      No follow-ups on file.     Problem List Items Addressed This Visit   None Visit Diagnoses     Pain of right hip    -  Primary Acute, ongoing Patient reports persistent pain of her right hip with radiation down her right shin stating that this has been unmanageable since 1st She has been evaluated by orthopedics, [facility], twice.  Most recent occasion was 02/24/2023 during which she was given tramadol for pain.  [facility] also discussed the possibility of a guided hip injection as well as potential pain management referral.  I am unsure of the status of this and communicated this with the patient. We discussed that I would not be able to provide her with refills for tramadol-she reports that she still has about 5 days worth of medication but is concerned about pain management past this. I discussed with patient that orthopedics would likely be the best  management for her symptoms.  She continued to voice frustration with her experience and indicated that she would like a new referral to orthopedics as well as a referral to pain management.  I provided both referrals  and also encouraged her to continue to reach out to [facility] for further information about desired procedure. We did discuss her concerns for potential blood clot and bone cancer. Examination of both lower extremities does not reveal concerning findings for blood clot at this time as noted in physical exam. Further workup for potential cancerous etiology may be discussed at a later date if she continues to have persistent problems. Recommend follow-up after her next appointment with orthopedics to further coordinate care    Relevant Orders   Ambulatory referral to Pain Clinic   Ambulatory referral to Orthopedics        No follow-ups on file.   I, Lakeysha Slutsky E Sal Spratley, PA-C, have reviewed all documentation for this visit. The documentation on 02/27/23 for the exam, diagnosis, procedures, and orders are all accurate and complete.   Jacquelin Hawking, MHS, PA-C [facility]

## 2023-03-04 ENCOUNTER — Telehealth: Payer: Self-pay

## 2023-03-04 NOTE — Transitions of Care (Post Inpatient/ED Visit) (Signed)
   03/04/2023  Name: Phyllis Hall MRN: [id number] DOB: [birth year]  Today's TOC FU Call Status: Today's TOC FU Call Status:: Unsuccessul Call (1st Attempt) Unsuccessful Call (1st Attempt) Date: 03/04/23  Attempted to reach the patient regarding the most recent Inpatient/ED visit.  Follow Up Plan: Additional outreach attempts will be made to reach the patient to complete the Transitions of Care (Post Inpatient/ED visit) call.   Signature: Wilhemena Durie, CMA

## 2023-03-04 NOTE — Transitions of Care (Post Inpatient/ED Visit) (Signed)
03/04/2023  Name: Phyllis Hall MRN: [id number] DOB: 06/22/1951  Today's TOC FU Call Status: Today's TOC FU Call Status:: Successful TOC FU Call Competed Unsuccessful Call (1st Attempt) Date: 03/04/23 [facility] FU Call Complete Date: 03/04/23  Transition Care Management Follow-up Telephone Call Date of Discharge: 03/03/23 Discharge Facility: Other Recruitment consultant Facility) Name of Other (Non[facility]) Discharge Facility: [facility] Type of Discharge: Emergency Department Reason for ED Visit: Orthopedic Conditions Orthopedic/Injury Diagnosis:  (R hip, R leg, and R knee pain) How have you been since you were released from the hospital?: Better Any questions or concerns?: No  Items Reviewed: Did you receive and understand the discharge instructions provided?: Yes Medications obtained,verified, and reconciled?: Yes (Medications Reviewed) Any new allergies since your discharge?: No Dietary orders reviewed?: NA Do you have support at home?: Yes People in Home: child(ren), adult  Medications Reviewed Today: Medications Reviewed Today     Reviewed by Pablo Ledger, CMA (Certified Medical Assistant) on 03/04/23 at 1000  Med List Status: <None>   Medication Order Taking? Sig Documenting Provider Last Dose Status Informant  alendronate (FOSAMAX) 70 MG tablet [id number] Yes Take 70 mg by mouth once a week. [provider] Taking Active   amitriptyline (ELAVIL) 10 MG tablet [id number] No TAKE 2 TABLETS BY MOUTH EVERY NIGHT AT BEDTIME  Patient not taking: Reported on 01/01/2023   Margaretann Loveless, MD Not Taking Active   ascorbic acid (VITAMIN C) 500 MG tablet [id number] Yes Take 500 mg by mouth daily. [provider] Taking Active   aspirin 81 MG tablet [id number] Yes Take 81 mg by mouth daily. [provider] Taking Active   calcium carbonate (OSCAL) 1500 (600 Ca) MG TABS tablet [id number] Yes Take 600 mg of elemental calcium by mouth 2 (two) times daily with a meal.  [provider] Taking Active   cholecalciferol (VITAMIN D3) 25 MCG (1000 UNIT) tablet [id number] Yes Take 1,000 Units by mouth daily. [provider] Taking Active   fluticasone (FLONASE) 50 MCG/ACT nasal spray [id number] Yes Place into both nostrils daily. [provider] Taking Active   JANUMET 50-1000 MG tablet [id number] Yes Take 1 tablet by mouth 2 (two) times daily with a meal. [provider] Taking Active   lisinopril (ZESTRIL) 2.5 MG tablet [id number] Yes Take 1 tablet (2.5 mg total) by mouth daily. Larae Grooms, NP Taking Active   montelukast (SINGULAIR) 10 MG tablet [id number] Yes Take 10 mg by mouth at bedtime. [provider] Taking Active   Multiple Vitamin (MULTIVITAMIN WITH MINERALS) TABS tablet [id number] Yes Take 1 tablet by mouth daily. [provider] Taking Active   pantoprazole (PROTONIX) 40 MG tablet [id number] Yes TAKE 1 TABLET BY MOUTH TWICE A DAY 30 MINUTES BEFORE MEALS [provider] Taking Active   predniSONE (DELTASONE) 20 MG tablet [id number] Yes Take 20 mg by mouth 2 (two) times daily with a meal. [provider] Taking Active   rosuvastatin (CRESTOR) 20 MG tablet [id number] Yes Take 20 mg by mouth daily. [provider] Taking Active   traMADol (ULTRAM) 50 MG tablet [id number] Yes Take 50 mg by mouth every 6 (six) hours as needed. [provider] Taking Active             Home Care and Equipment/Supplies: Were Home Health Services Ordered?: No Any new equipment or medical supplies ordered?: No  Functional Questionnaire: Do you need assistance with bathing/showering or dressing?: No Do you need assistance with meal preparation?: Yes (states she cannot  stand that long to cook a meal) Do you need assistance with eating?: No Do you have difficulty maintaining continence: No Do you need assistance with getting out of bed/getting out of a chair/moving?: No Do you have  difficulty managing or taking your medications?: No  Follow up appointments reviewed: PCP Follow-up appointment confirmed?: No MD Provider Line Number:[phone] Given: No Specialist Hospital Follow-up appointment confirmed?: Yes Date of Specialist follow-up appointment?: 04/07/23 Follow-Up Specialty Provider:: Dr. Okey Dupre- [facility] Do you need transportation to your follow-up appointment?: No Do you understand care options if your condition(s) worsen?: Yes-patient verbalized understanding    SIGNATURE: Wilhemena Durie, CMA

## 2023-03-06 ENCOUNTER — Telehealth: Payer: Self-pay

## 2023-03-06 NOTE — Telephone Encounter (Signed)
Copied from CRM [id number]. Topic: Referral - Request for Referral >> Mar 06, 2023 11:25 AM De Blanch wrote: Has patient seen PCP for this complaint? Yes.   *If NO, is insurance requiring patient see PCP for this issue before PCP can refer them? Referral for which specialty: N/A Preferred provider/office: Mayur K. Allena Katz, MD / Address: [address] [place], [place], [place] [address] Reason for referral:Pt is requesting a referral to a specialist for arthritis. She stated she was seen in the office 03/06/2023 she is still in pain, and she would like a referral sent.   Please advise.

## 2023-03-06 NOTE — Telephone Encounter (Signed)
Copied from CRM #[id number]. Topic: Referral - Request for Referral >> Mar 06, 2023 11:25 AM Alondra M wrote: Has patient seen PCP for this complaint? Yes.   *If NO, is insurance requiring patient see PCP for this issue before PCP can refer them? Referral for which specialty: N/A Preferred provider/office: Mayur K. Patel, MD / Address: [address], [place], [place] [address] Reason for referral:Pt is requesting a referral to a specialist for arthritis. She stated she was seen in the office 03/06/2023 she is still in pain, and she would like a referral sent.   Please advise.   

## 2023-03-07 NOTE — Telephone Encounter (Signed)
This provider is in Rheumatology. She has not had a workup or labs that indicate an autoimmune process so it is doubtful that they will accept a referral unless there is evidence of such a condition. She would need an apt to discuss and labs for testing. Also Rheumatology is often booked out for months even with a referral.

## 2023-03-12 NOTE — Telephone Encounter (Signed)
Called and scheduled patient appointment for tomorrow 03/13/2023 @ 10:40 am.

## 2023-03-12 NOTE — Telephone Encounter (Signed)
Please call and schedule the patient an appointment with Denny Peon to discuss the referral as stated below.

## 2023-03-13 ENCOUNTER — Encounter: Payer: Self-pay | Admitting: Physician Assistant

## 2023-03-13 ENCOUNTER — Ambulatory Visit (INDEPENDENT_AMBULATORY_CARE_PROVIDER_SITE_OTHER): Payer: Medicare Other | Admitting: Physician Assistant

## 2023-03-13 ENCOUNTER — Telehealth: Payer: Self-pay | Admitting: Nurse Practitioner

## 2023-03-13 VITALS — BP 114/73 | HR 101 | Temp 98.7°F | Wt 162.4 lb

## 2023-03-13 DIAGNOSIS — M1611 Unilateral primary osteoarthritis, right hip: Secondary | ICD-10-CM

## 2023-03-13 DIAGNOSIS — M1711 Unilateral primary osteoarthritis, right knee: Secondary | ICD-10-CM | POA: Diagnosis not present

## 2023-03-13 DIAGNOSIS — R21 Rash and other nonspecific skin eruption: Secondary | ICD-10-CM

## 2023-03-13 DIAGNOSIS — M19011 Primary osteoarthritis, right shoulder: Secondary | ICD-10-CM

## 2023-03-13 MED ORDER — MUPIROCIN 2 % EX OINT
1.0000 | TOPICAL_OINTMENT | Freq: Two times a day (BID) | CUTANEOUS | 0 refills | Status: DC
Start: 2023-03-13 — End: 2023-06-10

## 2023-03-13 NOTE — Progress Notes (Signed)
Acute Office Visit   Patient: Phyllis Hall   DOB: 1950/09/28   72 y.o. Female  MRN: [id number] Visit Date: 03/13/2023  Today's healthcare provider: Oswaldo Conroy Magenta Schmiesing, PA-C  Introduced myself to the patient as a Secondary school teacher and provided education on APPs in clinical practice.    Chief Complaint  Patient presents with   Referral    Pt states she would like to discuss going to Rheumatology.    Rash    Pt states she has a rash on both of her buttocks, states she believes it started around 03/03/23 when she started Prednisone. States it looks like round spots and does itch. States she had an old cream at home that she used and states it helped. States she does not remember the name of the cream.    Subjective    HPI HPI     Referral    Additional comments: Pt states she would like to discuss going to Rheumatology.         Rash    Additional comments: Pt states she has a rash on both of her buttocks, states she believes it started around 03/03/23 when she started Prednisone. States it looks like round spots and does itch. States she had an old cream at home that she used and states it helped. States she does not remember the name of the cream.       Last edited by Pablo Ledger, CMA on 03/13/2023 10:45 AM.        She reports she has finished her Prednisone  She reports significant relief in her right-sided pains after the steroid course  She reports her right knee and shoulder are predominant causes of pain  She reports she has an apt with pain management in the next few weeks   She was seen at [facility] with Dr. Rosita Kea  for evaluation of her right shoulder, hip and knee pain  She states Dr. Rosita Kea told her she might need a right shoulder surgery and right knee replacement due to severity of arthritis   RASH Duration:  days - states she noticed it last week  Location:  on buttocks bilaterally    Itching: yes Burning: yes- states it feels irritated  Redness:  yes Oozing: yes Scaling: no Blisters: yes Painful: no Fevers: no Change in detergents/soaps/personal care products: no Recent illness: no Recent travel:no History of same: yes but denies this severity  Context: worse Alleviating factors: nothing Treatments attempted: she has tried pouring peroxide and alcohol over it which caused burning. She tried a previous ointment and that seemed to help a bit  Shortness of breath: no  Throat/tongue swelling: no Myalgias/arthralgias: yes    Medications: Outpatient Medications Prior to Visit  Medication Sig   alendronate (FOSAMAX) 70 MG tablet Take 70 mg by mouth once a week.   amitriptyline (ELAVIL) 10 MG tablet TAKE 2 TABLETS BY MOUTH EVERY NIGHT AT BEDTIME   ascorbic acid (VITAMIN C) 500 MG tablet Take 500 mg by mouth daily.   aspirin 81 MG tablet Take 81 mg by mouth daily.   calcium carbonate (OSCAL) 1500 (600 Ca) MG TABS tablet Take 600 mg of elemental calcium by mouth 2 (two) times daily with a meal.   celecoxib (CELEBREX) 200 MG capsule Take 200 mg by mouth 2 (two) times daily.   cholecalciferol (VITAMIN D3) 25 MCG (1000 UNIT) tablet Take 1,000 Units by mouth daily.   fluticasone (FLONASE) 50 MCG/ACT  nasal spray Place into both nostrils daily.   gabapentin (NEURONTIN) 300 MG capsule Take 300 mg by mouth at bedtime.   JANUMET 50-1000 MG tablet Take 1 tablet by mouth 2 (two) times daily with a meal.   lisinopril (ZESTRIL) 2.5 MG tablet Take 1 tablet (2.5 mg total) by mouth daily.   meloxicam (MOBIC) 15 MG tablet Take 15 mg by mouth daily.   montelukast (SINGULAIR) 10 MG tablet Take 10 mg by mouth at bedtime.   Multiple Vitamin (MULTIVITAMIN WITH MINERALS) TABS tablet Take 1 tablet by mouth daily.   pantoprazole (PROTONIX) 40 MG tablet TAKE 1 TABLET BY MOUTH TWICE A DAY 30 MINUTES BEFORE MEALS   rosuvastatin (CRESTOR) 20 MG tablet Take 20 mg by mouth daily.   traMADol (ULTRAM) 50 MG tablet Take 50 mg by mouth every 6 (six) hours as  needed.   No facility-administered medications prior to visit.    Review of Systems  Musculoskeletal:  Positive for arthralgias, gait problem and myalgias.  Skin:  Positive for rash.       Objective    BP 114/73   Pulse (!) 101   Temp 98.7 F (37.1 C) (Oral)   Wt 162 lb 6.4 oz (73.7 kg)   SpO2 98%   BMI 26.41 kg/m    Physical Exam Skin:    General: Skin is warm and dry.     Findings: Rash present. Rash is papular and vesicular.       No results found for any visits on 03/13/23.  Assessment & Plan      No follow-ups on file.       Problem List Items Addressed This Visit   None Visit Diagnoses     Primary osteoarthritis of right knee    -  Primary Likely chronic, acute exacerbation Reviewed most recent Ortho visit notes for right knee, hip and shoulder visit Recommend she continues with those measures discussed with Dr. Rosita Kea Recommend she tries to be more active and walk to reduce stiffness and preserve ROM for all three joints She was prescribed Meloxicam and Gabapentin and has stopped using her Tramadol  We discussed referral to Physical medicine and rehab to assist with management and patient is also interested in Rheumatology referral We discussed that rheumatology may not agree to see her without labs or workup but these will likely be skewed due to recent steroid use- she voiced understanding  Recommend follow up as needed for persistent or progressing symptoms    Relevant Medications   meloxicam (MOBIC) 15 MG tablet   celecoxib (CELEBREX) 200 MG capsule   Other Relevant Orders   Ambulatory referral to Rheumatology   Ambulatory referral to Physical Medicine Rehab   Primary osteoarthritis of right shoulder       Relevant Medications   meloxicam (MOBIC) 15 MG tablet   celecoxib (CELEBREX) 200 MG capsule   Other Relevant Orders   Ambulatory referral to Rheumatology   Ambulatory referral to Physical Medicine Rehab   Primary osteoarthritis of right hip        Relevant Medications   meloxicam (MOBIC) 15 MG tablet   celecoxib (CELEBREX) 200 MG capsule   Other Relevant Orders   Ambulatory referral to Rheumatology   Ambulatory referral to Physical Medicine Rehab   Rash and nonspecific skin eruption     Acute, new concern She presents with papulovesicular rash on bilateral buttocks with some scabbing, drainage and crusting Rash appears consistent with potential HSV or shingles rash Since this has  been present for over a week- it is likely not going to benefit from antiviral medication She has Gabapentin from Orthopedics- recommend she uses this for pain and discomfort Will send in Mupirocin ointment to provide abx coverage Discussed keeping areas clean and dry and prevent further irritation- recommend she stops using alcohol or peroxide on the areas and just washes with gentle soap and warm water Can use nonstick gauze to prevent sticking to clothing and contamination  Follow up as needed for persistent or progressing symptoms     Relevant Medications   mupirocin ointment (BACTROBAN) 2 %        No follow-ups on file.   I, Jestine Bicknell E Quinnetta Roepke, PA-C, have reviewed all documentation for this visit. The documentation on 03/13/23 for the exam, diagnosis, procedures, and orders are all accurate and complete.   Jacquelin Hawking, MHS, PA-C [facility]

## 2023-03-13 NOTE — Patient Instructions (Addendum)
  I believe you may have shingles on your buttocks   I recommend that you continue to take your Gabapentin to help with pain and discomfort Since the rash has been present for over a week- it is a bit too late to start an anti-viral - it is unlikely it will be beneficial at this time  I have sent in a script for an ointment to help prevent infection - please apply this to the areas twice per day   Try to keep the areas dry and avoid wearing tight clothing over them as this can pull off any scabs - you can apply a nonstick gauze over it to help avoid irritation as needed   I do not recommend using alcohol or peroxide on the areas anymore- just gentle soap and warm water should be sufficient

## 2023-03-13 NOTE — Telephone Encounter (Signed)
Patients son Sherre Poot dropped off form for Disability Parking Placard to be completed by provider Larae Grooms, NP. Informed patient to allow 48-72 hours for completion. Patient would like a call when completed [phone]. Placed forms in providers box.

## 2023-03-13 NOTE — Telephone Encounter (Signed)
Placed in provider folder to be completed

## 2023-03-13 NOTE — Telephone Encounter (Signed)
Form completed for patient

## 2023-03-14 NOTE — Telephone Encounter (Signed)
Called pt no answer. Left vm stating that the form is completed and ready for pickup.  Will place form in completed bin

## 2023-04-07 ENCOUNTER — Other Ambulatory Visit: Payer: Self-pay | Admitting: Physical Medicine & Rehabilitation

## 2023-04-07 DIAGNOSIS — M5441 Lumbago with sciatica, right side: Secondary | ICD-10-CM

## 2023-04-09 ENCOUNTER — Ambulatory Visit: Payer: Medicare Other | Admitting: Pain Medicine

## 2023-04-10 ENCOUNTER — Other Ambulatory Visit: Payer: Self-pay | Admitting: Internal Medicine

## 2023-04-10 MED ORDER — CLOBETASOL PROPIONATE 0.05 % EX OINT: TOPICAL_OINTMENT | Freq: Two times a day (BID) | CUTANEOUS | 3 refills | Status: DC

## 2023-04-19 ENCOUNTER — Ambulatory Visit
Admission: RE | Admit: 2023-04-19 | Discharge: 2023-04-19 | Disposition: A | Discharge status: Home / Self Care | Payer: Medicare Other | Source: Ambulatory Visit | Attending: Physical Medicine & Rehabilitation | Admitting: Physical Medicine & Rehabilitation

## 2023-04-19 DIAGNOSIS — M5441 Lumbago with sciatica, right side: Secondary | ICD-10-CM

## 2023-04-22 ENCOUNTER — Other Ambulatory Visit: Payer: Self-pay | Admitting: Internal Medicine

## 2023-04-22 DIAGNOSIS — F409 Phobic anxiety disorder, unspecified: Secondary | ICD-10-CM

## 2023-04-25 ENCOUNTER — Other Ambulatory Visit: Payer: Self-pay | Admitting: Nurse Practitioner

## 2023-04-25 NOTE — Telephone Encounter (Signed)
Requested Prescriptions  Pending Prescriptions Disp Refills   lisinopril (ZESTRIL) 2.5 MG tablet [Pharmacy Med Name: LISINOPRIL 2.5MG  TABLETS] 90 tablet 0    Sig: TAKE 1 TABLET(2.5 MG) BY MOUTH DAILY     Cardiovascular:  ACE Inhibitors Passed - 04/25/2023  3:36 AM      Passed - Cr in normal range and within 180 days    Creatinine, Ser  Date Value Ref Range Status  10/30/2022 0.87 0.57 - 1.00 mg/dL Final         Passed - K in normal range and within 180 days    Potassium  Date Value Ref Range Status  10/30/2022 4.4 3.5 - 5.2 mmol/L Final         Passed - Patient is not pregnant      Passed - Last BP in normal range    BP Readings from Last 1 Encounters:  03/13/23 114/73         Passed - Valid encounter within last 6 months    Recent Outpatient Visits           1 month ago Primary osteoarthritis of right knee   [place] [facility] Mecum, Oswaldo Conroy, PA-C   1 month ago Pain of right hip   [place] [facility] Mecum, Oswaldo Conroy, PA-C   5 months ago Encounter for annual wellness exam in Medicare patient   [place] [facility] [place], Clydie Braun, NP   8 months ago Benign essential hypertension   [place] [facility] Larae Grooms, NP       Future Appointments             In 5 days Mecum, Oswaldo Conroy, PA-C [facility]

## 2023-04-30 ENCOUNTER — Ambulatory Visit (INDEPENDENT_AMBULATORY_CARE_PROVIDER_SITE_OTHER): Payer: Medicare Other | Admitting: Physician Assistant

## 2023-04-30 ENCOUNTER — Encounter: Payer: Self-pay | Admitting: Physician Assistant

## 2023-04-30 VITALS — BP 109/70 | HR 71 | Ht 65.75 in | Wt 161.4 lb

## 2023-04-30 DIAGNOSIS — R21 Rash and other nonspecific skin eruption: Secondary | ICD-10-CM

## 2023-04-30 DIAGNOSIS — Z7984 Long term (current) use of oral hypoglycemic drugs: Secondary | ICD-10-CM

## 2023-04-30 DIAGNOSIS — I10 Essential (primary) hypertension: Secondary | ICD-10-CM | POA: Diagnosis not present

## 2023-04-30 DIAGNOSIS — E119 Type 2 diabetes mellitus without complications: Secondary | ICD-10-CM

## 2023-04-30 DIAGNOSIS — M810 Age-related osteoporosis without current pathological fracture: Secondary | ICD-10-CM | POA: Diagnosis not present

## 2023-04-30 DIAGNOSIS — E782 Mixed hyperlipidemia: Secondary | ICD-10-CM

## 2023-04-30 MED ORDER — CLOBETASOL PROPIONATE 0.05 % EX OINT
TOPICAL_OINTMENT | Freq: Two times a day (BID) | CUTANEOUS | 3 refills | Status: AC
Start: 2023-04-30 — End: ?

## 2023-04-30 MED ORDER — ALENDRONATE SODIUM 70 MG PO TABS: 70.0000 mg | ORAL_TABLET | ORAL | 0 refills | Status: DC

## 2023-04-30 NOTE — Progress Notes (Unsigned)
Established Patient Office Visit  Name: Phyllis Hall   MRN: [id number]    DOB: [birth year]   Date:05/01/2023  Today's Provider: Jacquelin Hawking, MHS, PA-C Introduced myself to the patient as a PA-C and provided education on APPs in clinical practice.         Subjective  Chief Complaint  Chief Complaint  Patient presents with   Hyperlipidemia   Hypertension   Diabetes    HPI  She received a shot in her back earlier this week which has relieved a significant amount of her pain  She reports her symptoms have gotten notably better     HYPERTENSION / HYPERLIPIDEMIA Satisfied with current treatment? yes Duration of hypertension: years BP monitoring frequency: not checking BP range:  BP medication side effects: no Past BP meds: Lisinopril  Duration of hyperlipidemia: years Cholesterol medication side effects: no Cholesterol supplements: none Past cholesterol medications: rosuvastatin (crestor) Medication compliance: good compliance Aspirin: yes Recent stressors: yes Recurrent headaches: no Visual changes: no Palpitations: no Dyspnea: no Chest pain: no Lower extremity edema: no Dizzy/lightheaded: no  Diabetes, Type 2 - Last A1c 6.3 - Medications: Janumet 50-1000 PO BID  - Compliance: good compliance  - Checking BG at home: not checking unless she is feeling bad  - Eye exam: She has plans to schedule this later this year  - Foot exam: due at next apt  - Microalbumin: UTD - Statin: on statin therapy  - PNA vaccine: Completed  - Denies symptoms of hypoglycemia, polyuria, polydipsia, numbness extremities, foot ulcers/trauma   Patient Active Problem List   Diagnosis Date Noted   Advanced care planning/counseling discussion 10/30/2022   Osteoporosis 07/30/2022   Diabetes mellitus type 2, controlled, without complications (HCC) 09/04/2009   Benign essential hypertension 11/10/2006   Mixed hyperlipidemia 11/10/2006    Past Surgical History:  Procedure  Laterality Date   BREAST BIOPSY Right 2006   Breast Cancer   BREAST LUMPECTOMY     COLONOSCOPY WITH PROPOFOL N/A 12/04/2015   Procedure: COLONOSCOPY WITH PROPOFOL;  Surgeon: Elnita Maxwell, MD;  Location: [facility] ENDOSCOPY;  Service: Endoscopy;  Laterality: N/A;   COLONOSCOPY WITH PROPOFOL N/A 01/08/2023   Procedure: COLONOSCOPY WITH PROPOFOL;  Surgeon: Toledo, Boykin Nearing, MD;  Location: [facility] ENDOSCOPY;  Service: Gastroenterology;  Laterality: N/A;  DM   ESOPHAGOGASTRODUODENOSCOPY (EGD) WITH PROPOFOL N/A 01/08/2023   Procedure: ESOPHAGOGASTRODUODENOSCOPY (EGD) WITH PROPOFOL;  Surgeon: Toledo, Boykin Nearing, MD;  Location: [facility] ENDOSCOPY;  Service: Gastroenterology;  Laterality: N/A;   MASTECTOMY, PARTIAL      Family History  Problem Relation Age of Onset   Breast cancer Maternal Aunt 17    Social History   Tobacco Use   Smoking status: Former    Current packs/day: 0.00    Types: Cigarettes    Quit date: 10/13/1993    Years since quitting: 29.5   Smokeless tobacco: Never  Substance Use Topics   Alcohol use: Not Currently     Current Outpatient Medications:    amitriptyline (ELAVIL) 10 MG tablet, TAKE 2 TABLETS BY MOUTH EVERY NIGHT AT BEDTIME., Disp: 90 tablet, Rfl: 3   ascorbic acid (VITAMIN C) 500 MG tablet, Take 500 mg by mouth daily., Disp: , Rfl:    aspirin 81 MG tablet, Take 81 mg by mouth daily., Disp: , Rfl:    calcium carbonate (OSCAL) 1500 (600 Ca) MG TABS tablet, Take 600 mg of elemental calcium by mouth 2 (two) times daily with a meal.,  Disp: , Rfl:    celecoxib (CELEBREX) 200 MG capsule, Take 200 mg by mouth 2 (two) times daily., Disp: , Rfl:    cholecalciferol (VITAMIN D3) 25 MCG (1000 UNIT) tablet, Take 1,000 Units by mouth daily., Disp: , Rfl:    fluticasone (FLONASE) 50 MCG/ACT nasal spray, Place into both nostrils daily., Disp: , Rfl:    gabapentin (NEURONTIN) 300 MG capsule, Take 300 mg by mouth at bedtime., Disp: , Rfl:    JANUMET 50-1000 MG tablet, Take 1 tablet  by mouth 2 (two) times daily with a meal., Disp: , Rfl:    lisinopril (ZESTRIL) 2.5 MG tablet, TAKE 1 TABLET(2.5 MG) BY MOUTH DAILY, Disp: 90 tablet, Rfl: 0   meloxicam (MOBIC) 15 MG tablet, Take 15 mg by mouth daily., Disp: , Rfl:    montelukast (SINGULAIR) 10 MG tablet, Take 10 mg by mouth at bedtime., Disp: , Rfl:    Multiple Vitamin (MULTIVITAMIN WITH MINERALS) TABS tablet, Take 1 tablet by mouth daily., Disp: , Rfl:    mupirocin ointment (BACTROBAN) 2 %, Apply 1 Application topically 2 (two) times daily., Disp: 30 g, Rfl: 0   pantoprazole (PROTONIX) 40 MG tablet, TAKE 1 TABLET BY MOUTH TWICE A DAY 30 MINUTES BEFORE MEALS, Disp: , Rfl:    rosuvastatin (CRESTOR) 20 MG tablet, Take 20 mg by mouth daily., Disp: , Rfl:    traMADol (ULTRAM) 50 MG tablet, Take 50 mg by mouth every 6 (six) hours as needed., Disp: , Rfl:    alendronate (FOSAMAX) 70 MG tablet, Take 1 tablet (70 mg total) by mouth once a week., Disp: 12 tablet, Rfl: 0   clobetasol ointment (TEMOVATE) 0.05 %, Apply topically 2 (two) times daily., Disp: 60 g, Rfl: 3  Allergies  Allergen Reactions   Codeine Nausea And Vomiting    I personally reviewed active problem list, medication list, allergies, health maintenance, notes from last encounter, lab results with the patient/caregiver today.   Review of Systems  Eyes:  Negative for blurred vision, double vision and photophobia.  Respiratory:  Negative for shortness of breath.   Cardiovascular:  Negative for chest pain, palpitations and leg swelling.  Neurological:  Negative for dizziness, tingling and headaches.      Objective  Vitals:   04/30/23 0917  BP: 109/70  Pulse: 71  SpO2: 97%  Weight: 161 lb 6.4 oz (73.2 kg)  Height: 5' 5.75" (1.67 m)    Body mass index is 26.25 kg/m.  Physical Exam Vitals reviewed.  Constitutional:      General: She is awake.     Appearance: Normal appearance. She is well-developed and well-groomed.  HENT:     Head: Normocephalic and  atraumatic.  Cardiovascular:     Rate and Rhythm: Normal rate and regular rhythm.     Pulses: Normal pulses.          Radial pulses are 2+ on the right side and 2+ on the left side.     Heart sounds: Normal heart sounds. No murmur heard.    No friction rub. No gallop.  Pulmonary:     Effort: Pulmonary effort is normal.     Breath sounds: Normal breath sounds. No decreased air movement. No decreased breath sounds, wheezing, rhonchi or rales.  Musculoskeletal:     Right lower leg: No edema.     Left lower leg: No edema.  Neurological:     Mental Status: She is alert.  Psychiatric:        Behavior: Behavior is  cooperative.      Recent Results (from the past 2160 hour(s))  Lipid Profile     Status: None   Collection Time: 04/30/23  9:58 AM  Result Value Ref Range   Cholesterol, Total 119 100 - 199 mg/dL   Triglycerides 161 0 - 149 mg/dL   HDL 56 >09 mg/dL   VLDL Cholesterol Cal 20 5 - 40 mg/dL   LDL Chol Calc (NIH) 43 0 - 99 mg/dL   Chol/HDL Ratio 2.1 0.0 - 4.4 ratio    Comment:                                   T. Chol/HDL Ratio                                             Men  Women                               1/2 Avg.Risk  3.4    3.3                                   Avg.Risk  5.0    4.4                                2X Avg.Risk  9.6    7.1                                3X Avg.Risk 23.4   11.0   Comp Met (CMET)     Status: None   Collection Time: 04/30/23  9:58 AM  Result Value Ref Range   Glucose 90 70 - 99 mg/dL   BUN 19 8 - 27 mg/dL   Creatinine, Ser 6.04 0.57 - 1.00 mg/dL   eGFR 70 >54 UJ/WJX/9.14   BUN/Creatinine Ratio 22 12 - 28   Sodium 139 134 - 144 mmol/L   Potassium 4.5 3.5 - 5.2 mmol/L   Chloride 100 96 - 106 mmol/L   CO2 26 20 - 29 mmol/L   Calcium 9.5 8.7 - 10.3 mg/dL   Total Protein 6.6 6.0 - 8.5 g/dL   Albumin 4.3 3.8 - 4.8 g/dL   Globulin, Total 2.3 1.5 - 4.5 g/dL   Bilirubin Total 0.3 0.0 - 1.2 mg/dL   Alkaline Phosphatase 66 44 - 121 IU/L    AST 15 0 - 40 IU/L   ALT 14 0 - 32 IU/L  CBC w/Diff     Status: None   Collection Time: 04/30/23  9:58 AM  Result Value Ref Range   WBC 9.9 3.4 - 10.8 x10E3/uL   RBC 4.51 3.77 - 5.28 x10E6/uL   Hemoglobin 12.5 11.1 - 15.9 g/dL   Hematocrit 78.2 95.6 - 46.6 %   MCV 87 79 - 97 fL   MCH 27.7 26.6 - 33.0 pg   MCHC 31.8 31.5 - 35.7 g/dL   RDW 21.3 08.6 - 57.8 %   Platelets 257 150 - 450 x10E3/uL   Neutrophils 61 Not Estab. %   Lymphs 27  Not Estab. %   Monocytes 9 Not Estab. %   Eos 2 Not Estab. %   Basos 1 Not Estab. %   Neutrophils Absolute 6.0 1.4 - 7.0 x10E3/uL   Lymphocytes Absolute 2.7 0.7 - 3.1 x10E3/uL   Monocytes Absolute 0.9 0.1 - 0.9 x10E3/uL   EOS (ABSOLUTE) 0.2 0.0 - 0.4 x10E3/uL   Basophils Absolute 0.1 0.0 - 0.2 x10E3/uL   Immature Granulocytes 0 Not Estab. %   Immature Grans (Abs) 0.0 0.0 - 0.1 x10E3/uL  HgB A1c     Status: Abnormal   Collection Time: 04/30/23  9:58 AM  Result Value Ref Range   Hgb A1c MFr Bld 6.4 (H) 4.8 - 5.6 %    Comment:          Prediabetes: 5.7 - 6.4          Diabetes: >6.4          Glycemic control for adults with diabetes: <7.0    Est. average glucose Bld gHb Est-mCnc 137 mg/dL     QMV7/8:    12/28/9627    9:23 AM 10/30/2022   10:06 AM  Depression screen PHQ 2/9  Decreased Interest 0 0  Down, Depressed, Hopeless 0 0  PHQ - 2 Score 0 0  Altered sleeping 0 0  Tired, decreased energy 0 0  Change in appetite 0 0  Feeling bad or failure about yourself  0 0  Trouble concentrating 0 0  Moving slowly or fidgety/restless 0 0  Suicidal thoughts 0 0  PHQ-9 Score 0 0  Difficult doing work/chores Not difficult at all Not difficult at all      Fall Risk:    04/30/2023    9:22 AM 10/30/2022   10:06 AM  Fall Risk   Falls in the past year? 0 0  Number falls in past yr: 0 0  Injury with Fall? 0 0  Risk for fall due to : No Fall Risks No Fall Risks  Follow up Falls evaluation completed Falls evaluation completed      Functional Status  Survey:      Assessment & Plan  Problem List Items Addressed This Visit       Cardiovascular and Mediastinum   Benign essential hypertension    Chronic, start condition Appears well-controlled with current regimen comprised of lisinopril 2.5 mg p.o. daily Continue current regimen She appears to have refills available at this time Recommend follow-up in 6 months or sooner if concerns arise      Relevant Orders   Comp Met (CMET) (Completed)   CBC w/Diff (Completed)     Endocrine   Diabetes mellitus type 2, controlled, without complications (HCC)    Chronic, historic condition Recheck A1c today, most recent was 6.3 Reviewed importance of annual eye exams Recommend that she continue with current regimen comprised of Janumet 50-1000 mg p.o. twice daily Refills to be provided when she is closer to needing them Follow-up in 6 months or sooner if concerns arise      Relevant Orders   HgB A1c (Completed)     Musculoskeletal and Integument   Osteoporosis - Primary    Chronic, historic condition Most recent DEXA was done in September 2023 She is currently taking Fosamax once per week and appears to be tolerating well. Refills provided today Follow-up in 6 months or sooner if concerns arise      Relevant Medications   alendronate (FOSAMAX) 70 MG tablet     Other   Mixed hyperlipidemia  Chronic, historic condition Appears well-managed on current regimen comprised of rosuvastatin 20 mg p.o. daily Recheck lipid panel today-results to dictate further management Continue current regimen for now Follow-up in 6 months or sooner if concerns arise      Relevant Orders   Lipid Profile (Completed)   Other Visit Diagnoses     Rash of genital area     Acute, recurrent condition Patient reports that she sometimes gets a mild rash in her genital region for which she has been using clobetasol ointment.  She request refills today-refills provided Follow-up as needed for  persistent or progressing symptoms   Relevant Medications   clobetasol ointment (TEMOVATE) 0.05 %        Return in about 6 months (around 10/31/2023) for HTN, Diabetes follow up.   I, Electra Paladino E Chinelo Benn, PA-C, have reviewed all documentation for this visit. The documentation on 05/01/23 for the exam, diagnosis, procedures, and orders are all accurate and complete.   Jacquelin Hawking, MHS, PA-C [facility]

## 2023-05-01 NOTE — Assessment & Plan Note (Signed)
Chronic, historic condition Appears well-managed on current regimen comprised of rosuvastatin 20 mg p.o. daily Recheck lipid panel today-results to dictate further management Continue current regimen for now Follow-up in 6 months or sooner if concerns arise

## 2023-05-01 NOTE — Assessment & Plan Note (Signed)
Chronic, historic condition Recheck A1c today, most recent was 6.3 Reviewed importance of annual eye exams Recommend that she continue with current regimen comprised of Janumet 50-1000 mg p.o. twice daily Refills to be provided when she is closer to needing them Follow-up in 6 months or sooner if concerns arise

## 2023-05-01 NOTE — Assessment & Plan Note (Signed)
Chronic, historic condition Most recent DEXA was done in September 2023 She is currently taking Fosamax once per week and appears to be tolerating well. Refills provided today Follow-up in 6 months or sooner if concerns arise

## 2023-05-01 NOTE — Assessment & Plan Note (Signed)
Chronic, start condition Appears well-controlled with current regimen comprised of lisinopril 2.5 mg p.o. daily Continue current regimen She appears to have refills available at this time Recommend follow-up in 6 months or sooner if concerns arise

## 2023-05-02 NOTE — Progress Notes (Signed)
Labs are normal/stable. Please continue with your current regimen

## 2023-05-17 ENCOUNTER — Other Ambulatory Visit: Payer: Self-pay | Admitting: Internal Medicine

## 2023-05-17 DIAGNOSIS — J301 Allergic rhinitis due to pollen: Secondary | ICD-10-CM

## 2023-05-17 DIAGNOSIS — K219 Gastro-esophageal reflux disease without esophagitis: Secondary | ICD-10-CM

## 2023-06-09 ENCOUNTER — Ambulatory Visit: Payer: Self-pay | Admitting: *Deleted

## 2023-06-09 NOTE — Telephone Encounter (Signed)
  Chief Complaint: Chest Pain Symptoms: Left breast area, radiates under arm, up through neck. Worsens at neck with taking deep breath. Frequency: 2 am Pertinent Negatives: Patient denies  Disposition: [x] ED /[] Urgent Care (no appt availability in office) / [] Appointment(In office/virtual)/ []  [place]/ [] Home Care/ [] Refused Recommended Disposition /[] [facility]/ []  Follow-up with PCP Additional Notes: Pt states "Just don't feel right." Advised ED. States will follow disposition.   Has driver. Reason for Disposition  Taking a deep breath makes pain worse  Answer Assessment - Initial Assessment Questions 1. LOCATION: "Where does it hurt?"       Left breast, under arm 2. RADIATION: "Does the pain go anywhere else?" (e.g., into neck, jaw, arms, back)     Left side of neck 3. ONSET: "When did the chest pain begin?" (Minutes, hours or days)      2 AM 4. PATTERN: "Does the pain come and go, or has it been constant since it started?"  "Does it get worse with exertion?"      Constant 5. DURATION: "How long does it last" (e.g., seconds, minutes, hours)     Constant 6. SEVERITY: "How bad is the pain?"  (e.g., Scale 1-10; mild, moderate, or severe)    - MILD (1-3): doesn't interfere with normal activities     - MODERATE (4-7): interferes with normal activities or awakens from sleep    - SEVERE (8-10): excruciating pain, unable to do any normal activities       3/10 7. CARDIAC RISK FACTORS: "Do you have any history of heart problems or risk factors for heart disease?" (e.g., angina, prior heart attack; diabetes, high blood pressure, high cholesterol, smoker, or strong family history of heart disease)     *No Answer* 8. PULMONARY RISK FACTORS: "Do you have any history of lung disease?"  (e.g., blood clots in lung, asthma, emphysema, birth control pills)     *No Answer* 9. CAUSE: "What do you think is causing the chest pain?"     Unsure 10. OTHER SYMPTOMS: "Do you have  any other symptoms?" (e.g., dizziness, nausea, vomiting, sweating, fever, difficulty breathing, cough)       No  Protocols used: Chest Pain-A-AH

## 2023-06-10 ENCOUNTER — Ambulatory Visit (INDEPENDENT_AMBULATORY_CARE_PROVIDER_SITE_OTHER): Payer: Medicare Other | Admitting: Pediatrics

## 2023-06-10 ENCOUNTER — Encounter: Payer: Self-pay | Admitting: Pediatrics

## 2023-06-10 ENCOUNTER — Ambulatory Visit
Admission: RE | Admit: 2023-06-10 | Discharge: 2023-06-10 | Disposition: A | Discharge status: Home / Self Care | Payer: Medicare Other | Source: Ambulatory Visit | Attending: Pediatrics | Admitting: Pediatrics

## 2023-06-10 VITALS — BP 97/66 | HR 98 | Ht 65.75 in | Wt 163.0 lb

## 2023-06-10 DIAGNOSIS — R0789 Other chest pain: Secondary | ICD-10-CM | POA: Diagnosis not present

## 2023-06-10 DIAGNOSIS — R071 Chest pain on breathing: Secondary | ICD-10-CM | POA: Diagnosis present

## 2023-06-10 NOTE — Progress Notes (Signed)
BP 97/66   Pulse 98   Ht 5' 5.75" (1.67 m)   Wt 163 lb (73.9 kg)   SpO2 93%   BMI 26.51 kg/m    Subjective:    Patient ID: Phyllis Hall, female    DOB: 1951-08-14, 72 y.o.   MRN: [id number]  HPI: Phyllis Hall is a 72 y.o. female  Chief Complaint  Patient presents with   Chest Pain    Patient says yesterday afternoon around 2 pm, she had some pain in her chest pain and it radiating under her L breast. Patient says she went to [facility] ER and they ran test on her and she was given medication at the hospital. Patient says she is not feeling better and says she feel like something is sitting on her chest and she says she has a history of a broken sternum and says the pain seems similar. Patient says she has had shingles before in the area and says it feels the same.    #Chest pain Started yesterday, radiates to neck; triggered by something she ate Went to ER, negative cardiac work up; GI cocktail wo improvement Feels something is sitting on her chest Started with headache, then developed shortness of breath Has constant pressure, no relief for the last 48 hours Not a smoker Had cough a few weeks ago, no other infectious symptoms No sick contacts In June she had singles , wonders if feels like this; no rash at this time Took 2 ibuprofen after ED, nothing changes Worsened laying down does not improve,  Has not had palpations, unrelated to activity, no bilateral leg swelling, no improvement with position changes  Relevant past medical, surgical, family and social history reviewed and updated as indicated. Interim medical history since our last visit reviewed. Allergies and medications reviewed and updated.  ROS per HPI unless specifically indicated above     Objective:    BP 97/66   Pulse 98   Ht 5' 5.75" (1.67 m)   Wt 163 lb (73.9 kg)   SpO2 93%   BMI 26.51 kg/m   Wt Readings from Last 3 Encounters:  06/10/23 163 lb (73.9 kg)  04/30/23 161 lb 6.4 oz (73.2 kg)   03/13/23 162 lb 6.4 oz (73.7 kg)     Physical Exam HENT:     Head: Normocephalic and atraumatic.  Cardiovascular:     Rate and Rhythm: Normal rate and regular rhythm.     Heart sounds: Normal heart sounds. No murmur heard.    No friction rub. No gallop.  Pulmonary:     Effort: No tachypnea, accessory muscle usage or respiratory distress.     Breath sounds: Normal breath sounds. No stridor. No decreased breath sounds.     Comments: She seems uncomfortable with deep breathing, otherwise effort normal Chest:     Chest wall: No mass or deformity.     Comments: Notes pressure/discomfort with palpations intermittently Abdominal:     General: Bowel sounds are normal.     Palpations: Abdomen is soft.     Tenderness: There is no abdominal tenderness.  Neurological:     Mental Status: She is alert.     EKG: normal in office, NSR, no ischemic changes D-dimer at [facility]: 432 06/09/23     Assessment & Plan:  Assessment & Plan   Other chest pain Chest pressure Patient with persistent chest pressure for 48 hours without relief. R/o MI in ED yesterday, had normal dimer and xray as well.  Vitals normal, she is still symptomatic in clinic. EKG reassuring today. She did note she had a cough a few weeks ago, may be pleuritis and/or costochondritis type pain. Unclear etiology, plan on CT chest wo contrast. Additionally would benefit from stress test at some point but suspect MSK and/or pulmonology pathology driving her issue. Pt given strict return precautions and anticipatory guidance for ED.  -     EKG 12-Lead -     CT CHEST WO CONTRAST; Future -     Ambulatory referral to Cardiology   Follow up plan: Return if symptoms worsen or fail to improve.  Kasem Mozer Howell Pringle, MD

## 2023-06-10 NOTE — Patient Instructions (Addendum)
Plan: - CT of your chest - referral to cardiology for stress testing  Once I receive the results will let you know next steps.  Return to the Emergency Department immediately if you develop worsened or increased chest pain, shortness of breath, fainting spells, sudden sweatiness, pain radiating to your back, coughing up blood, leg swelling, fevers greater than 100.39F that do not resolve with Tylenol, or any other symptoms that concern you.

## 2023-07-17 ENCOUNTER — Other Ambulatory Visit: Payer: Self-pay | Admitting: Orthopedic Surgery

## 2023-07-17 DIAGNOSIS — M19011 Primary osteoarthritis, right shoulder: Secondary | ICD-10-CM

## 2023-07-20 ENCOUNTER — Other Ambulatory Visit: Payer: Self-pay | Admitting: Physician Assistant

## 2023-07-20 DIAGNOSIS — M810 Age-related osteoporosis without current pathological fracture: Secondary | ICD-10-CM

## 2023-07-21 NOTE — Telephone Encounter (Signed)
Requested medications are due for refill today.  yes  Requested medications are on the active medications list.  yes  Last refill. 04/30/2023 #12 0 rf  Future visit scheduled.   yes  Notes to clinic.  Missing labs    Requested Prescriptions  Pending Prescriptions Disp Refills   alendronate (FOSAMAX) 70 MG tablet [Pharmacy Med Name: ALENDRONATE 70MG  TABLETS] 12 tablet 0    Sig: TAKE 1 TABLET(70 MG) BY MOUTH 1 TIME A WEEK     Endocrinology:  Bisphosphonates Failed - 07/20/2023  3:39 AM      Failed - Vitamin D in normal range and within 360 days    No results found for: "UU7253GU4", "QI3474QV9", "VD125OH2TOT", "25OHVITD3", "25OHVITD2", "25OHVITD1", "VD25OH"       Failed - Mg Level in normal range and within 360 days    No results found for: "MG"       Failed - Phosphate in normal range and within 360 days    No results found for: "PHOS"       Passed - Ca in normal range and within 360 days    Calcium  Date Value Ref Range Status  04/30/2023 9.5 8.7 - 10.3 mg/dL Final         Passed - Cr in normal range and within 360 days    Creatinine, Ser  Date Value Ref Range Status  04/30/2023 0.87 0.57 - 1.00 mg/dL Final         Passed - eGFR is 30 or above and within 360 days    eGFR  Date Value Ref Range Status  04/30/2023 70 >59 mL/min/1.73 Final         Passed - Valid encounter within last 12 months    Recent Outpatient Visits           1 month ago Other chest pain   [place] [facility] Jackolyn Confer, MD   2 months ago Age-related osteoporosis without current pathological fracture   [place] [facility] Mecum, Oswaldo Conroy, PA-C   4 months ago Primary osteoarthritis of right knee   [place] [facility] Mecum, Erin E, PA-C   4 months ago Pain of right hip   [place] [facility] Mecum, Oswaldo Conroy, PA-C   8 months ago Encounter for annual wellness exam in Medicare patient   [facility]  Larae Grooms, NP       Future Appointments             In 3 months Larae Grooms, NP  [facility]            Passed - Bone Mineral Density or Dexa Scan completed in the last 2 years

## 2023-07-24 ENCOUNTER — Ambulatory Visit: Payer: Medicare Other | Admitting: Cardiology

## 2023-07-25 ENCOUNTER — Other Ambulatory Visit: Payer: Self-pay | Admitting: Nurse Practitioner

## 2023-07-25 NOTE — Telephone Encounter (Signed)
Requested Prescriptions  Pending Prescriptions Disp Refills   lisinopril (ZESTRIL) 2.5 MG tablet [Pharmacy Med Name: LISINOPRIL 2.5MG  TABLETS] 90 tablet 0    Sig: TAKE 1 TABLET(2.5 MG) BY MOUTH DAILY     Cardiovascular:  ACE Inhibitors Passed - 07/25/2023  3:35 AM      Passed - Cr in normal range and within 180 days    Creatinine, Ser  Date Value Ref Range Status  04/30/2023 0.87 0.57 - 1.00 mg/dL Final         Passed - K in normal range and within 180 days    Potassium  Date Value Ref Range Status  04/30/2023 4.5 3.5 - 5.2 mmol/L Final         Passed - Patient is not pregnant      Passed - Last BP in normal range    BP Readings from Last 1 Encounters:  06/10/23 97/66         Passed - Valid encounter within last 6 months    Recent Outpatient Visits           1 month ago Other chest pain   [place] [facility] Jackolyn Confer, MD   2 months ago Age-related osteoporosis without current pathological fracture   [place] [facility] Mecum, Oswaldo Conroy, PA-C   4 months ago Primary osteoarthritis of right knee   [place] [facility] Mecum, Oswaldo Conroy, PA-C   4 months ago Pain of right hip   [place] [facility] Mecum, Oswaldo Conroy, PA-C   8 months ago Encounter for annual wellness exam in Medicare patient   [place] [facility] Larae Grooms, NP       Future Appointments             In 3 months Larae Grooms, NP [place] [facility], [facility]

## 2023-08-05 ENCOUNTER — Other Ambulatory Visit: Payer: Medicare Other

## 2023-08-06 ENCOUNTER — Other Ambulatory Visit: Payer: Self-pay | Admitting: Nurse Practitioner

## 2023-08-06 ENCOUNTER — Encounter: Payer: Self-pay | Admitting: Internal Medicine

## 2023-08-06 DIAGNOSIS — Z1231 Encounter for screening mammogram for malignant neoplasm of breast: Secondary | ICD-10-CM

## 2023-08-12 ENCOUNTER — Ambulatory Visit
Admission: RE | Admit: 2023-08-12 | Discharge: 2023-08-12 | Disposition: A | Discharge status: Home / Self Care | Payer: Medicare Other | Source: Ambulatory Visit | Attending: Nurse Practitioner | Admitting: Nurse Practitioner

## 2023-08-12 DIAGNOSIS — Z1231 Encounter for screening mammogram for malignant neoplasm of breast: Secondary | ICD-10-CM | POA: Insufficient documentation

## 2023-08-22 LAB — HM DIABETES EYE EXAM

## 2023-08-23 ENCOUNTER — Ambulatory Visit
Admission: RE | Admit: 2023-08-23 | Discharge: 2023-08-23 | Disposition: A | Discharge status: Home / Self Care | Payer: Medicare Other | Source: Ambulatory Visit | Attending: Orthopedic Surgery | Admitting: Orthopedic Surgery

## 2023-08-23 DIAGNOSIS — M19011 Primary osteoarthritis, right shoulder: Secondary | ICD-10-CM

## 2023-10-13 ENCOUNTER — Other Ambulatory Visit: Payer: Self-pay | Admitting: Nurse Practitioner

## 2023-10-13 DIAGNOSIS — M810 Age-related osteoporosis without current pathological fracture: Secondary | ICD-10-CM

## 2023-10-13 NOTE — Telephone Encounter (Signed)
Requested Prescriptions  Pending Prescriptions Disp Refills   alendronate (FOSAMAX) 70 MG tablet [Pharmacy Med Name: ALENDRONATE 70MG  TABLETS] 12 tablet 0    Sig: TAKE 1 TABLET(70 MG) BY MOUTH 1 TIME A WEEK     Endocrinology:  Bisphosphonates Failed - 10/13/2023  9:37 AM      Failed - Vitamin D in normal range and within 360 days    No results found for: "ZO1096EA5", "WU9811BJ4", "VD125OH2TOT", "25OHVITD3", "25OHVITD2", "25OHVITD1", "VD25OH"       Failed - Mg Level in normal range and within 360 days    No results found for: "MG"       Failed - Phosphate in normal range and within 360 days    No results found for: "PHOS"       Passed - Ca in normal range and within 360 days    Calcium  Date Value Ref Range Status  04/30/2023 9.5 8.7 - 10.3 mg/dL Final         Passed - Cr in normal range and within 360 days    Creatinine, Ser  Date Value Ref Range Status  04/30/2023 0.87 0.57 - 1.00 mg/dL Final         Passed - eGFR is 30 or above and within 360 days    eGFR  Date Value Ref Range Status  04/30/2023 70 >59 mL/min/1.73 Final         Passed - Valid encounter within last 12 months    Recent Outpatient Visits           4 months ago Other chest pain   [place] [facility] Jackolyn Confer, MD   5 months ago Age-related osteoporosis without current pathological fracture   [place] [facility] Mecum, Oswaldo Conroy, PA-C   7 months ago Primary osteoarthritis of right knee   [place] [facility] Mecum, Erin E, PA-C   7 months ago Pain of right hip   [place] [facility] Mecum, Oswaldo Conroy, PA-C   11 months ago Encounter for annual wellness exam in Medicare patient   [place] [facility] Larae Grooms, NP       Future Appointments             In 2 weeks Larae Grooms, NP  [facility]            Passed - Bone Mineral Density or Dexa Scan completed in the last 2 years

## 2023-10-23 ENCOUNTER — Other Ambulatory Visit: Payer: Self-pay | Admitting: Nurse Practitioner

## 2023-10-24 NOTE — Telephone Encounter (Signed)
Requested Prescriptions  Pending Prescriptions Disp Refills   lisinopril (ZESTRIL) 2.5 MG tablet [Pharmacy Med Name: LISINOPRIL 2.5MG  TABLETS] 90 tablet 0    Sig: TAKE 1 TABLET(2.5 MG) BY MOUTH DAILY     Cardiovascular:  ACE Inhibitors Passed - 10/24/2023  9:32 AM      Passed - Cr in normal range and within 180 days    Creatinine, Ser  Date Value Ref Range Status  04/30/2023 0.87 0.57 - 1.00 mg/dL Final         Passed - K in normal range and within 180 days    Potassium  Date Value Ref Range Status  04/30/2023 4.5 3.5 - 5.2 mmol/L Final         Passed - Patient is not pregnant      Passed - Last BP in normal range    BP Readings from Last 1 Encounters:  06/10/23 97/66         Passed - Valid encounter within last 6 months    Recent Outpatient Visits           4 months ago Other chest pain   [facility] Jackolyn Confer, MD   5 months ago Age-related osteoporosis without current pathological fracture   [place] [facility] Mecum, Oswaldo Conroy, PA-C   7 months ago Primary osteoarthritis of right knee   [place] [facility] Mecum, Oswaldo Conroy, PA-C   7 months ago Pain of right hip   [place] [facility] Mecum, Oswaldo Conroy, PA-C   11 months ago Encounter for annual wellness exam in Medicare patient   [place] [facility] Larae Grooms, NP       Future Appointments             In 1 week Larae Grooms, NP [facility], [facility]

## 2023-10-31 ENCOUNTER — Encounter: Payer: Self-pay | Admitting: Nurse Practitioner

## 2023-10-31 ENCOUNTER — Ambulatory Visit: Payer: Medicare Other | Admitting: Nurse Practitioner

## 2023-10-31 VITALS — BP 124/83 | HR 73 | Ht 65.75 in | Wt 167.4 lb

## 2023-10-31 DIAGNOSIS — Z0000 Encounter for general adult medical examination without abnormal findings: Secondary | ICD-10-CM | POA: Diagnosis not present

## 2023-10-31 DIAGNOSIS — I10 Essential (primary) hypertension: Secondary | ICD-10-CM | POA: Diagnosis not present

## 2023-10-31 DIAGNOSIS — E782 Mixed hyperlipidemia: Secondary | ICD-10-CM | POA: Diagnosis not present

## 2023-10-31 DIAGNOSIS — E119 Type 2 diabetes mellitus without complications: Secondary | ICD-10-CM | POA: Diagnosis not present

## 2023-10-31 DIAGNOSIS — Z7189 Other specified counseling: Secondary | ICD-10-CM

## 2023-10-31 LAB — MICROALBUMIN, URINE WAIVED
Creatinine, Urine Waived: 300 mg/dL (ref 10–300)
Microalb, Ur Waived: 150 mg/L — ABNORMAL HIGH (ref 0–19)

## 2023-10-31 NOTE — Assessment & Plan Note (Signed)
 A voluntary discussion about advance care planning including the explanation and discussion of advance directives was extensively discussed  with the patient for 5 minutes with patient.  Explanation about the health care proxy and Living will was reviewed and packet with forms with explanation of how to fill them out was given.  During this discussion, the patient was able to identify a health care proxy as Grayce Potters and does not wish to make any changes at this time.

## 2023-10-31 NOTE — Assessment & Plan Note (Signed)
 Chronic.  Controlled.  Continue with current medication regimen.  Last A1c 6.4 %.  Eye exam up to date.  Labs ordered today.  Return to clinic in 6 months for reevaluation.  Call sooner if concerns arise.

## 2023-10-31 NOTE — Assessment & Plan Note (Signed)
Chronic.  Controlled.  Continue with current medication regimen of Crestor 20mg daily.  Labs ordered today.  Return to clinic in 6 months for reevaluation.  Call sooner if concerns arise.   

## 2023-10-31 NOTE — Progress Notes (Signed)
 BP 124/83 (BP Location: Right Arm, Patient Position: Sitting, Cuff Size: Normal)   Pulse 73   Ht 5' 5.75 (1.67 m)   Wt 167 lb 6.4 oz (75.9 kg)   SpO2 97%   BMI 27.22 kg/m    Subjective:    Patient ID: Phyllis Hall, female    DOB: 1950-11-27, 73 y.o.   MRN: [id number]  HPI: Phyllis Hall is a 73 y.o. female presenting on 10/31/2023 for comprehensive medical examination. Current medical complaints include:none  She currently lives with: Menopausal Symptoms: no  HYPERTENSION without Chronic Kidney Disease Hypertension status: controlled  Satisfied with current treatment? yes Duration of hypertension: years BP monitoring frequency:  not checking BP range:  BP medication side effects:  no Medication compliance: excellent compliance Previous BP meds:none Aspirin: no Recurrent headaches: no Visual changes: no Palpitations: no Dyspnea: no Chest pain: no Lower extremity edema: no Dizzy/lightheaded: no  DIABETES Tolerating Janumet  well.  A1c is well controlled 6.4%. Hypoglycemic episodes:no Polydipsia/polyuria: no Visual disturbance: no Chest pain: no Paresthesias: no Glucose Monitoring: no  Accucheck frequency: Not Checking  Fasting glucose:  Post prandial:  Evening:  Before meals: Taking Insulin?: no  Long acting insulin:  Short acting insulin: Blood Pressure Monitoring: not checking Retinal Examination:  up to date Foot Exam: Up to Date Diabetic Education: Not Completed Pneumovax: Up to Date Influenza: Up to Date Aspirin: no  Patient has recently been sick.  She is getting over it but the cough is still lingering.  Still a little congested.     Functional Status Survey: Is the patient deaf or have difficulty hearing?: No Does the patient have difficulty seeing, even when wearing glasses/contacts?: Yes Does the patient have difficulty concentrating, remembering, or making decisions?: Yes Does the patient have difficulty walking or climbing  stairs?: Yes Does the patient have difficulty dressing or bathing?: No Does the patient have difficulty doing errands alone such as visiting a doctor's office or shopping?: No     04/30/2023    9:22 AM 10/30/2022   10:06 AM  Fall Risk   Falls in the past year? 0 0  Number falls in past yr: 0 0  Injury with Fall? 0 0  Risk for fall due to : No Fall Risks No Fall Risks  Follow up Falls evaluation completed Falls evaluation completed    Depression Screen    10/31/2023    9:43 AM 04/30/2023    9:23 AM 10/30/2022   10:06 AM  Depression screen PHQ 2/9  Decreased Interest 0 0 0  Down, Depressed, Hopeless 0 0 0  PHQ - 2 Score 0 0 0  Altered sleeping 0 0 0  Tired, decreased energy 0 0 0  Change in appetite 0 0 0  Feeling bad or failure about yourself  0 0 0  Trouble concentrating 0 0 0  Moving slowly or fidgety/restless 0 0 0  Suicidal thoughts 0 0 0  PHQ-9 Score 0 0 0  Difficult doing work/chores  Not difficult at all Not difficult at all      10/31/2023    9:47 AM  6CIT Screen  What Year? 0 points  What month? 0 points  What time? 0 points  Count back from 20 0 points  Months in reverse 0 points  Repeat phrase 2 points  Total Score 2 points      Advanced Directives Does patient have a HCPOA?    yes If yes, name and contact information: Grayce Potters Does patient  have a living will or MOST form?  no  Past Medical History:  Past Medical History:  Diagnosis Date   Breast cancer (HCC) 2006   right breast with lumpectomy and rad tx   Diabetes mellitus without complication (HCC)    GERD (gastroesophageal reflux disease)    Hyperlipidemia    Melanoma (HCC)     Surgical History:  Past Surgical History:  Procedure Laterality Date   BREAST BIOPSY Right 2006   Breast Cancer   BREAST LUMPECTOMY     COLONOSCOPY WITH PROPOFOL  N/A 12/04/2015   Procedure: COLONOSCOPY WITH PROPOFOL ;  Surgeon: Donnice Vaughn Manes, MD;  [facility] ENDOSCOPY;  Service: Endoscopy;  Laterality: N/A;    COLONOSCOPY WITH PROPOFOL  N/A 01/08/2023   Procedure: COLONOSCOPY WITH PROPOFOL ;  Surgeon: Toledo, Ladell POUR, MD;  Location: [facility] ENDOSCOPY;  Service: Gastroenterology;  Laterality: N/A;  DM   ESOPHAGOGASTRODUODENOSCOPY (EGD) WITH PROPOFOL  N/A 01/08/2023   Procedure: ESOPHAGOGASTRODUODENOSCOPY (EGD) WITH PROPOFOL ;  Surgeon: Toledo, Ladell POUR, MD;  Location: [facility] ENDOSCOPY;  Service: Gastroenterology;  Laterality: N/A;   MASTECTOMY, PARTIAL      Medications:  Current Outpatient Medications on File Prior to Visit  Medication Sig   alendronate  (FOSAMAX ) 70 MG tablet TAKE 1 TABLET(70 MG) BY MOUTH 1 TIME A WEEK   amitriptyline (ELAVIL) 10 MG tablet TAKE 2 TABLETS BY MOUTH EVERY NIGHT AT BEDTIME.   ascorbic acid (VITAMIN C) 500 MG tablet Take 500 mg by mouth daily.   calcium  carbonate (OSCAL) 1500 (600 Ca) MG TABS tablet Take 600 mg of elemental calcium  by mouth 2 (two) times daily with a meal.   cholecalciferol (VITAMIN D3) 25 MCG (1000 UNIT) tablet Take 1,000 Units by mouth daily.   clobetasol  ointment (TEMOVATE ) 0.05 % Apply topically 2 (two) times daily.   fluticasone (FLONASE) 50 MCG/ACT nasal spray Place into both nostrils daily.   JANUMET  50-1000 MG tablet Take 1 tablet by mouth 2 (two) times daily with a meal.   lisinopril  (ZESTRIL ) 2.5 MG tablet TAKE 1 TABLET(2.5 MG) BY MOUTH DAILY   meloxicam (MOBIC) 15 MG tablet Take 15 mg by mouth daily.   montelukast  (SINGULAIR ) 10 MG tablet TAKE 1 TABLET BY MOUTH ONCE DAILY   Multiple Vitamin (MULTIVITAMIN WITH MINERALS) TABS tablet Take 1 tablet by mouth daily.   pantoprazole  (PROTONIX ) 40 MG tablet TAKE 1 TABLET BY MOUTH ONCE DAILY   rosuvastatin  (CRESTOR ) 20 MG tablet Take 20 mg by mouth daily.   traMADol (ULTRAM) 50 MG tablet Take 50 mg by mouth every 6 (six) hours as needed.   gabapentin (NEURONTIN) 300 MG capsule Take 300 mg by mouth at bedtime.   No current facility-administered medications on file prior to visit.    Allergies:   Allergies  Allergen Reactions   Codeine Nausea And Vomiting    Social History:  Social History   Socioeconomic History   Marital status: Widowed    Spouse name: Not on file   Number of children: Not on file   Years of education: Not on file   Highest education level: Not on file  Occupational History   Not on file  Tobacco Use   Smoking status: Former    Current packs/day: 0.00    Types: Cigarettes    Quit date: 10/13/1993    Years since quitting: 30.0   Smokeless tobacco: Never  Vaping Use   Vaping status: Never Used  Substance and Sexual Activity   Alcohol use: Not Currently   Drug use: No   Sexual  activity: Not Currently  Other Topics Concern   Not on file  Social History Narrative   Not on file   Social Drivers of Health   Financial Resource Strain: Patient Declined (10/28/2023)   Overall Financial Resource Strain (CARDIA)    Difficulty of Paying Living Expenses: Patient declined  Food Insecurity: Patient Declined (10/28/2023)   Hunger Vital Sign    Worried About Running Out of Food in the Last Year: Patient declined    Ran Out of Food in the Last Year: Patient declined  Transportation Needs: No Transportation Needs (10/28/2023)   PRAPARE - Administrator, Civil Service (Medical): No    Lack of Transportation (Non-Medical): No  Physical Activity: Insufficiently Active (10/28/2023)   Exercise Vital Sign    Days of Exercise per Week: 2 days    Minutes of Exercise per Session: 10 min  Stress: No Stress Concern Present (10/28/2023)   [employer] of Occupational Health - Occupational Stress Questionnaire    Feeling of Stress : Only a little  Social Connections: Unknown (10/28/2023)   Social Connection and Isolation Panel [NHANES]    Frequency of Communication with Friends and Family: Once a week    Frequency of Social Gatherings with Friends and Family: Patient declined    Attends Religious Services: Patient declined    Database Administrator or  Organizations: No    Attends Engineer, Structural: Not on file    Marital Status: Widowed  Intimate Partner Violence: Not on file   Social History   Tobacco Use  Smoking Status Former   Current packs/day: 0.00   Types: Cigarettes   Quit date: 10/13/1993   Years since quitting: 30.0  Smokeless Tobacco Never   Social History   Substance and Sexual Activity  Alcohol Use Not Currently    Family History:  Family History  Problem Relation Age of Onset   Breast cancer Maternal Aunt 35    Past medical history, surgical history, medications, allergies, family history and social history reviewed with patient today and changes made to appropriate areas of the chart.   Review of Systems  HENT:         Denies vision changes.  Eyes:  Negative for blurred vision and double vision.  Respiratory:  Positive for cough. Negative for shortness of breath.   Cardiovascular:  Negative for chest pain, palpitations and leg swelling.  Neurological:  Negative for dizziness, tingling and headaches.  Endo/Heme/Allergies:  Negative for polydipsia.       Denies Polyuria    All other ROS negative except what is listed above and in the HPI.      Objective:    BP 124/83 (BP Location: Right Arm, Patient Position: Sitting, Cuff Size: Normal)   Pulse 73   Ht 5' 5.75 (1.67 m)   Wt 167 lb 6.4 oz (75.9 kg)   SpO2 97%   BMI 27.22 kg/m   Wt Readings from Last 3 Encounters:  10/31/23 167 lb 6.4 oz (75.9 kg)  06/10/23 163 lb (73.9 kg)  04/30/23 161 lb 6.4 oz (73.2 kg)    No results found.  Physical Exam Vitals and nursing note reviewed.  Constitutional:      General: She is not in acute distress.    Appearance: Normal appearance. She is normal weight. She is not ill-appearing, toxic-appearing or diaphoretic.  HENT:     Head: Normocephalic.     Right Ear: External ear normal.     Left Ear: External ear  normal.     Nose: Nose normal.     Mouth/Throat:     Mouth: Mucous membranes are  moist.     Pharynx: Oropharynx is clear.  Eyes:     General:        Right eye: No discharge.        Left eye: No discharge.     Extraocular Movements: Extraocular movements intact.     Conjunctiva/sclera: Conjunctivae normal.     Pupils: Pupils are equal, round, and reactive to light.  Cardiovascular:     Rate and Rhythm: Normal rate and regular rhythm.     Heart sounds: No murmur heard. Pulmonary:     Effort: Pulmonary effort is normal. No respiratory distress.     Breath sounds: Normal breath sounds. No wheezing or rales.  Musculoskeletal:     Cervical back: Normal range of motion and neck supple.  Skin:    General: Skin is warm and dry.     Capillary Refill: Capillary refill takes less than 2 seconds.  Neurological:     General: No focal deficit present.     Mental Status: She is alert and oriented to person, place, and time. Mental status is at baseline.  Psychiatric:        Mood and Affect: Mood normal.        Behavior: Behavior normal.        Thought Content: Thought content normal.        Judgment: Judgment normal.        10/31/2023    9:47 AM  6CIT Screen  What Year? 0 points  What month? 0 points  What time? 0 points  Count back from 20 0 points  Months in reverse 0 points  Repeat phrase 2 points  Total Score 2 points    Cognitive Testing - 6-CIT  Correct? Score   What year is it? yes 0 Yes = 0    No = 4  What month is it? yes 0 Yes = 0    No = 3  Remember:     Norleen Sharps, [address], [place]     What time is it? yes 0 Yes = 0    No = 3  Count backwards from 20 to 1 yes 0 Correct = 0    1 error = 2   More than 1 error = 4  Say the months of the year in reverse. yes 0 Correct = 0    1 error = 2   More than 1 error = 4  What address did I ask you to remember? yes 2 Correct = 0  1 error = 2    2 error = 4    3 error = 6    4 error = 8    All wrong = 10       TOTAL SCORE  2/28   Interpretation:  Normal  Normal (0-7) Abnormal (8-28)   Results for  orders placed or performed in visit on 08/25/23  HM DIABETES EYE EXAM   Collection Time: 08/22/23 10:05 AM  Result Value Ref Range   HM Diabetic Eye Exam No Retinopathy No Retinopathy      Assessment & Plan:   Problem List Items Addressed This Visit       Cardiovascular and Mediastinum   Benign essential hypertension   Chronic.  Controlled.  Will start Lisinopril  2.5mg  daily for kidney protection.  Labs ordered today.  Return to clinic in  6 months for reevaluation.  Hall sooner if concerns arise.       Relevant Orders   Comp Met (CMET)     Endocrine   Diabetes mellitus type 2, controlled, without complications (HCC)   Chronic.  Controlled.  Continue with current medication regimen.  Last A1c 6.4 %.  Eye exam up to date.  Labs ordered today.  Return to clinic in 6 months for reevaluation.  Hall sooner if concerns arise.       Relevant Orders   Hemoglobin A1c   Microalbumin, Urine Waived     Other   Mixed hyperlipidemia   Chronic.  Controlled.  Continue with current medication regimen of Crestor  20mg  daily.  Labs ordered today.  Return to clinic in 6 months for reevaluation.  Hall sooner if concerns arise.       Relevant Orders   Lipid panel   Advanced care planning/counseling discussion   A voluntary discussion about advance care planning including the explanation and discussion of advance directives was extensively discussed  with the patient for 5 minutes with patient.  Explanation about the health care proxy and Living will was reviewed and packet with forms with explanation of how to fill them out was given.  During this discussion, the patient was able to identify a health care proxy as Grayce Potters and does not wish to make any changes at this time.        Other Visit Diagnoses       Encounter for annual wellness exam in Medicare patient    -  Primary         Preventative Services:  AAA screening: NA Health Risk Assessment and Personalized Prevention Plan: Up to  date Bone Mass Measurements: Up to date Breast Cancer Screening: Up to date CVD Screening: Up to date Cervical Cancer Screening: NA Colon Cancer Screening: Up to date Depression Screening: Up to date Diabetes Screening: Up to date Glaucoma Screening: Up to date Hepatitis B vaccine: NA Hepatitis C screening: Up to date HIV Screening: Up to date Flu Vaccine: Up to date Lung cancer Screening: NA Obesity Screening: Up to date Pneumonia Vaccines (2): Up to date STI Screening: NA  Follow up plan: Return in about 6 months (around 04/29/2024) for HTN, HLD, DM2 FU.   LABORATORY TESTING:  - Pap smear: not applicable  IMMUNIZATIONS:   - Tdap: Tetanus vaccination status reviewed: last tetanus booster within 10 years. - Influenza: Up to date - Pneumovax: Up to date - Prevnar: Up to date - Zostavax vaccine: Up to date  SCREENING: -Mammogram: Up to date  - Colonoscopy: Up to date  - Bone Density: Up to date  -Hearing Test: Not applicable  -Spirometry: Not applicable   PATIENT COUNSELING:   Advised to take 1 mg of folate supplement per day if capable of pregnancy.   Sexuality: Discussed sexually transmitted diseases, partner selection, use of condoms, avoidance of unintended pregnancy  and contraceptive alternatives.   Advised to avoid cigarette smoking.  I discussed with the patient that most people either abstain from alcohol or drink within safe limits (<=14/week and <=4 drinks/occasion for males, <=7/weeks and <= 3 drinks/occasion for females) and that the risk for alcohol disorders and other health effects rises proportionally with the number of drinks per week and how often a drinker exceeds daily limits.  Discussed cessation/primary prevention of drug use and availability of treatment for abuse.   Diet: Encouraged to adjust caloric intake to maintain  or achieve ideal body  weight, to reduce intake of dietary saturated fat and total fat, to limit sodium intake by avoiding high  sodium foods and not adding table salt, and to maintain adequate dietary potassium and calcium  preferably from fresh fruits, vegetables, and low-fat dairy products.    stressed the importance of regular exercise  Injury prevention: Discussed safety belts, safety helmets, smoke detector, smoking near bedding or upholstery.   Dental health: Discussed importance of regular tooth brushing, flossing, and dental visits.    NEXT PREVENTATIVE PHYSICAL DUE IN 1 YEAR. Return in about 6 months (around 04/29/2024) for HTN, HLD, DM2 FU.

## 2023-10-31 NOTE — Assessment & Plan Note (Signed)
 Chronic.  Controlled.  Will start Lisinopril  2.5mg  daily for kidney protection.  Labs ordered today.  Return to clinic in 6 months for reevaluation.  Call sooner if concerns arise.

## 2023-11-01 LAB — LIPID PANEL
Chol/HDL Ratio: 3.8 {ratio} (ref 0.0–4.4)
Cholesterol, Total: 145 mg/dL (ref 100–199)
HDL: 38 mg/dL — ABNORMAL LOW (ref >39)
LDL Chol Calc (NIH): 76 mg/dL (ref 0–99)
Triglycerides: 181 mg/dL — ABNORMAL HIGH (ref 0–149)
VLDL Cholesterol Cal: 31 mg/dL (ref 5–40)

## 2023-11-01 LAB — COMPREHENSIVE METABOLIC PANEL
ALT: 14 [IU]/L (ref 0–32)
AST: 15 [IU]/L (ref 0–40)
Albumin: 4.2 g/dL (ref 3.8–4.8)
Alkaline Phosphatase: 83 [IU]/L (ref 44–121)
BUN/Creatinine Ratio: 14 (ref 12–28)
BUN: 12 mg/dL (ref 8–27)
Bilirubin Total: 0.4 mg/dL (ref 0.0–1.2)
CO2: 25 mmol/L (ref 20–29)
Calcium: 9.5 mg/dL (ref 8.7–10.3)
Chloride: 104 mmol/L (ref 96–106)
Creatinine, Ser: 0.88 mg/dL (ref 0.57–1.00)
Globulin, Total: 2.4 g/dL (ref 1.5–4.5)
Glucose: 90 mg/dL (ref 70–99)
Potassium: 4.4 mmol/L (ref 3.5–5.2)
Sodium: 143 mmol/L (ref 134–144)
Total Protein: 6.6 g/dL (ref 6.0–8.5)
eGFR: 69 mL/min/{1.73_m2} (ref >59)

## 2023-11-01 LAB — HEMOGLOBIN A1C
Est. average glucose Bld gHb Est-mCnc: 140 mg/dL
Hgb A1c MFr Bld: 6.5 % — ABNORMAL HIGH (ref 4.8–5.6)

## 2023-11-03 ENCOUNTER — Encounter: Payer: Self-pay | Admitting: Nurse Practitioner

## 2024-01-09 ENCOUNTER — Other Ambulatory Visit: Payer: Self-pay | Admitting: Nurse Practitioner

## 2024-01-09 DIAGNOSIS — M810 Age-related osteoporosis without current pathological fracture: Secondary | ICD-10-CM

## 2024-01-09 NOTE — Telephone Encounter (Signed)
 Requested medications are due for refill today.  yes  Requested medications are on the active medications list.  yes  Last refill. 10/13/2023 #12 0 rf  Future visit scheduled.   yes  Notes to clinic.  Missing labs.    Requested Prescriptions  Pending Prescriptions Disp Refills   alendronate  (FOSAMAX ) 70 MG tablet [Pharmacy Med Name: ALENDRONATE  70MG  TABLETS] 12 tablet 0    Sig: TAKE 1 TABLET(70 MG) BY MOUTH 1 TIME A WEEK     Endocrinology:  Bisphosphonates Failed - 01/09/2024  1:40 PM      Failed - Vitamin D in normal range and within 360 days    No results found for: "[id number]", "OZ3086VH8", "VD125OH2TOT", "25OHVITD3", "25OHVITD2", "25OHVITD1", "VD25OH"       Failed - Mg Level in normal range and within 360 days    No results found for: "MG"       Failed - Phosphate in normal range and within 360 days    No results found for: "PHOS"       Passed - Ca in normal range and within 360 days    Calcium  Date Value Ref Range Status  10/31/2023 9.5 8.7 - 10.3 mg/dL Final         Passed - Cr in normal range and within 360 days    Creatinine, Ser  Date Value Ref Range Status  10/31/2023 0.88 0.57 - 1.00 mg/dL Final         Passed - eGFR is 30 or above and within 360 days    eGFR  Date Value Ref Range Status  10/31/2023 69 >59 mL/min/1.73 Final         Passed - Valid encounter within last 12 months    Recent Outpatient Visits           2 months ago Encounter for annual wellness exam in Medicare patient   [place] [facility] Aileen Alexanders, NP       Future Appointments             In 3 months Aileen Alexanders, NP [place] [facility]            Passed - Bone Mineral Density or Dexa Scan completed in the last 2 years

## 2024-01-26 ENCOUNTER — Other Ambulatory Visit: Payer: Self-pay | Admitting: Nurse Practitioner

## 2024-01-27 NOTE — Telephone Encounter (Signed)
 Requested Prescriptions  Pending Prescriptions Disp Refills   lisinopril  (ZESTRIL ) 2.5 MG tablet [Pharmacy Med Name: LISINOPRIL  2.5MG  TABLETS] 90 tablet 1    Sig: TAKE 1 TABLET(2.5 MG) BY MOUTH DAILY     Cardiovascular:  ACE Inhibitors Passed - 01/27/2024  4:03 PM      Passed - Cr in normal range and within 180 days    Creatinine, Ser  Date Value Ref Range Status  10/31/2023 0.88 0.57 - 1.00 mg/dL Final         Passed - K in normal range and within 180 days    Potassium  Date Value Ref Range Status  10/31/2023 4.4 3.5 - 5.2 mmol/L Final         Passed - Patient is not pregnant      Passed - Last BP in normal range    BP Readings from Last 1 Encounters:  10/31/23 124/83         Passed - Valid encounter within last 6 months    Recent Outpatient Visits           2 months ago Encounter for annual wellness exam in Medicare patient   [place] [facility] Aileen Alexanders, NP       Future Appointments             In 3 months Aileen Alexanders, NP [facility] [facility]

## 2024-01-31 ENCOUNTER — Other Ambulatory Visit: Payer: Self-pay | Admitting: Nurse Practitioner

## 2024-01-31 DIAGNOSIS — M810 Age-related osteoporosis without current pathological fracture: Secondary | ICD-10-CM

## 2024-02-03 NOTE — Telephone Encounter (Signed)
 Too soon for refill, refilled 01/12/24 for 12 tabs.  Requested Prescriptions  Pending Prescriptions Disp Refills   alendronate  (FOSAMAX ) 70 MG tablet [Pharmacy Med Name: ALENDRONATE  70MG  TABLETS] 12 tablet 0    Sig: TAKE 1 TABLET(70 MG) BY MOUTH 1 TIME A WEEK     Endocrinology:  Bisphosphonates Failed - 02/03/2024  9:10 AM      Failed - Vitamin D in normal range and within 360 days    No results found for: "[id number]", "VH8469GE9", "VD125OH2TOT", "25OHVITD3", "25OHVITD2", "25OHVITD1", "VD25OH"       Failed - Mg Level in normal range and within 360 days    No results found for: "MG"       Failed - Phosphate in normal range and within 360 days    No results found for: "PHOS"       Passed - Ca in normal range and within 360 days    Calcium  Date Value Ref Range Status  10/31/2023 9.5 8.7 - 10.3 mg/dL Final         Passed - Cr in normal range and within 360 days    Creatinine, Ser  Date Value Ref Range Status  10/31/2023 0.88 0.57 - 1.00 mg/dL Final         Passed - eGFR is 30 or above and within 360 days    eGFR  Date Value Ref Range Status  10/31/2023 69 >59 mL/min/1.73 Final         Passed - Valid encounter within last 12 months    Recent Outpatient Visits           3 months ago Encounter for annual wellness exam in Medicare patient   [place] [facility] Phyllis Alexanders, NP       Future Appointments             In 2 months Phyllis Alexanders, NP [place] [facility]            Passed - Bone Mineral Density or Dexa Scan completed in the last 2 years

## 2024-02-26 ENCOUNTER — Encounter: Payer: Self-pay | Admitting: Nurse Practitioner

## 2024-02-26 ENCOUNTER — Ambulatory Visit (INDEPENDENT_AMBULATORY_CARE_PROVIDER_SITE_OTHER): Admitting: Nurse Practitioner

## 2024-02-26 VITALS — BP 132/79 | HR 76 | Ht 65.75 in | Wt 168.0 lb

## 2024-02-26 DIAGNOSIS — F409 Phobic anxiety disorder, unspecified: Secondary | ICD-10-CM

## 2024-02-26 DIAGNOSIS — K219 Gastro-esophageal reflux disease without esophagitis: Secondary | ICD-10-CM

## 2024-02-26 DIAGNOSIS — E782 Mixed hyperlipidemia: Secondary | ICD-10-CM

## 2024-02-26 DIAGNOSIS — J301 Allergic rhinitis due to pollen: Secondary | ICD-10-CM

## 2024-02-26 DIAGNOSIS — I10 Essential (primary) hypertension: Secondary | ICD-10-CM

## 2024-02-26 DIAGNOSIS — M810 Age-related osteoporosis without current pathological fracture: Secondary | ICD-10-CM | POA: Diagnosis not present

## 2024-02-26 DIAGNOSIS — E119 Type 2 diabetes mellitus without complications: Secondary | ICD-10-CM | POA: Diagnosis not present

## 2024-02-26 DIAGNOSIS — F5105 Insomnia due to other mental disorder: Secondary | ICD-10-CM

## 2024-02-26 DIAGNOSIS — K5900 Constipation, unspecified: Secondary | ICD-10-CM

## 2024-02-26 MED ORDER — MONTELUKAST SODIUM 10 MG PO TABS
10.0000 mg | ORAL_TABLET | Freq: Every day | ORAL | 1 refills | Status: AC
Start: 2024-02-26 — End: ?

## 2024-02-26 MED ORDER — ROSUVASTATIN CALCIUM 20 MG PO TABS: 20.0000 mg | ORAL_TABLET | Freq: Every day | ORAL | 1 refills | Status: DC

## 2024-02-26 MED ORDER — ALENDRONATE SODIUM 70 MG PO TABS: 70.0000 mg | ORAL_TABLET | ORAL | 0 refills | Status: DC

## 2024-02-26 MED ORDER — PANTOPRAZOLE SODIUM 40 MG PO TBEC
40.0000 mg | DELAYED_RELEASE_TABLET | Freq: Every day | ORAL | 1 refills | Status: AC
Start: 2024-02-26 — End: ?

## 2024-02-26 MED ORDER — MUPIROCIN 2 % EX OINT: 1.0000 | TOPICAL_OINTMENT | Freq: Two times a day (BID) | CUTANEOUS | 2 refills | Status: AC

## 2024-02-26 MED ORDER — JANUMET 50-1000 MG PO TABS: 1.0000 | ORAL_TABLET | Freq: Two times a day (BID) | ORAL | 1 refills | Status: DC

## 2024-02-26 MED ORDER — LISINOPRIL 2.5 MG PO TABS: 2.5000 mg | ORAL_TABLET | Freq: Every day | ORAL | 1 refills | Status: DC

## 2024-02-26 NOTE — Assessment & Plan Note (Signed)
 Chronic.  Controlled.  Will start Lisinopril  2.5mg  daily for kidney protection.  Labs ordered today.  Return to clinic in 6 months for reevaluation.  Call sooner if concerns arise.

## 2024-02-26 NOTE — Assessment & Plan Note (Signed)
 Chronic.  Controlled.  Continue with current medication regimen.  Last A1c 6.5%.  Eye exam up to date.  Labs ordered today.  Return to clinic in 6 months for reevaluation.  Call sooner if concerns arise.

## 2024-02-26 NOTE — Assessment & Plan Note (Signed)
Chronic, historic condition Most recent DEXA was done in September 2023 She is currently taking Fosamax once per week and appears to be tolerating well. Refills provided today Follow-up in 6 months or sooner if concerns arise

## 2024-02-26 NOTE — Assessment & Plan Note (Signed)
Chronic.  Controlled.  Continue with current medication regimen of Crestor 20mg daily.  Labs ordered today.  Return to clinic in 6 months for reevaluation.  Call sooner if concerns arise.   

## 2024-02-26 NOTE — Progress Notes (Signed)
 BP 132/79   Pulse 76   Ht 5' 5.75" (1.67 m)   Wt 168 lb (76.2 kg)   BMI 27.32 kg/m    Subjective:    Patient ID: Phyllis Hall, female    DOB: [birth year], 73 y.o.   MRN: [id number]  HPI: Phyllis Hall is a 73 y.o. female  Chief Complaint  Patient presents with   Medicare Wellness   Constipation    On and off for some time, takes a laxative daily (Colace)   HYPERTENSION / HYPERLIPIDEMIA Satisfied with current treatment? yes Duration of hypertension: years BP monitoring frequency: not checking BP range:  BP medication side effects: no Past BP meds: Lisinopril   Duration of hyperlipidemia: years Cholesterol medication side effects: no Cholesterol supplements: none Past cholesterol medications: rosuvastatin (crestor) Medication compliance: good compliance Aspirin: yes Recent stressors: yes Recurrent headaches: no Visual changes: no Palpitations: no Dyspnea: no Chest pain: no Lower extremity edema: no Dizzy/lightheaded: no  Diabetes, Type 2 - Last A1c 6.3 - Medications: Janumet 50-1000 PO BID  - Compliance: good compliance  - Checking BG at home: not checking unless she is feeling bad  - Eye exam: She has plans to schedule this later this year  - Foot exam: due at next apt  - Microalbumin: UTD - Statin: on statin therapy  - PNA vaccine: Completed  - Denies symptoms of hypoglycemia, polyuria, polydipsia, numbness extremities, foot ulcers/trauma  Patient states she has been having issues with bowel movements.  States she can go 2-3 days without going.  She is taking Colace daily.  It doesn't seem to be helping with her symptoms.  She is drinking 4 8 ounces bottles per day.     Relevant past medical, surgical, family and social history reviewed and updated as indicated. Interim medical history since our last visit reviewed. Allergies and medications reviewed and updated.  Review of Systems  Eyes:  Negative for visual disturbance.  Respiratory:   Negative for cough, chest tightness and shortness of breath.   Cardiovascular:  Negative for chest pain, palpitations and leg swelling.  Gastrointestinal:  Positive for constipation.  Endocrine: Negative for polydipsia and polyuria.  Neurological:  Negative for dizziness, numbness and headaches.    Per HPI unless specifically indicated above     Objective:     BP 132/79   Pulse 76   Ht 5' 5.75" (1.67 m)   Wt 168 lb (76.2 kg)   BMI 27.32 kg/m   Wt Readings from Last 3 Encounters:  02/26/24 168 lb (76.2 kg)  10/31/23 167 lb 6.4 oz (75.9 kg)  06/10/23 163 lb (73.9 kg)    Physical Exam Vitals and nursing note reviewed.  Constitutional:      General: She is not in acute distress.    Appearance: Normal appearance. She is normal weight. She is not ill-appearing, toxic-appearing or diaphoretic.  HENT:     Head: Normocephalic.     Right Ear: External ear normal.     Left Ear: External ear normal.     Nose: Nose normal.     Mouth/Throat:     Mouth: Mucous membranes are moist.     Pharynx: Oropharynx is clear.  Eyes:     General:        Right eye: No discharge.        Left eye: No discharge.     Extraocular Movements: Extraocular movements intact.     Conjunctiva/sclera: Conjunctivae normal.     Pupils: Pupils are equal, round,  and reactive to light.  Cardiovascular:     Rate and Rhythm: Normal rate and regular rhythm.     Heart sounds: No murmur heard. Pulmonary:     Effort: Pulmonary effort is normal. No respiratory distress.     Breath sounds: Normal breath sounds. No wheezing or rales.  Musculoskeletal:     Cervical back: Normal range of motion and neck supple.  Skin:    General: Skin is warm and dry.     Capillary Refill: Capillary refill takes less than 2 seconds.  Neurological:     General: No focal deficit present.     Mental Status: She is alert and oriented to person, place, and time. Mental status is at baseline.  Psychiatric:        Mood and Affect: Mood  normal.        Behavior: Behavior normal.        Thought Content: Thought content normal.        Judgment: Judgment normal.     Results for orders placed or performed in visit on 10/31/23  Microalbumin, Urine Waived   Collection Time: 10/31/23 10:06 AM  Result Value Ref Range   Microalb, Ur Waived 150 (H) 0 - 19 mg/L   Creatinine, Urine Waived 300 10 - 300 mg/dL   Microalb/Creat Ratio 30-300 (H) <30 mg/g  Comp Met (CMET)   Collection Time: 10/31/23 10:07 AM  Result Value Ref Range   Glucose 90 70 - 99 mg/dL   BUN 12 8 - 27 mg/dL   Creatinine, Ser 7.82 0.57 - 1.00 mg/dL   eGFR 69 >95 AO/ZHY/8.65   BUN/Creatinine Ratio 14 12 - 28   Sodium 143 134 - 144 mmol/L   Potassium 4.4 3.5 - 5.2 mmol/L   Chloride 104 96 - 106 mmol/L   CO2 25 20 - 29 mmol/L   Calcium 9.5 8.7 - 10.3 mg/dL   Total Protein 6.6 6.0 - 8.5 g/dL   Albumin 4.2 3.8 - 4.8 g/dL   Globulin, Total 2.4 1.5 - 4.5 g/dL   Bilirubin Total 0.4 0.0 - 1.2 mg/dL   Alkaline Phosphatase 83 44 - 121 IU/L   AST 15 0 - 40 IU/L   ALT 14 0 - 32 IU/L  Hemoglobin A1c   Collection Time: 10/31/23 10:07 AM  Result Value Ref Range   Hgb A1c MFr Bld 6.5 (H) 4.8 - 5.6 %   Est. average glucose Bld gHb Est-mCnc 140 mg/dL  Lipid panel   Collection Time: 10/31/23 10:07 AM  Result Value Ref Range   Cholesterol, Total 145 100 - 199 mg/dL   Triglycerides 784 (H) 0 - 149 mg/dL   HDL 38 (L) >69 mg/dL   VLDL Cholesterol Cal 31 5 - 40 mg/dL   LDL Chol Calc (NIH) 76 0 - 99 mg/dL   Chol/HDL Ratio 3.8 0.0 - 4.4 ratio      Assessment & Plan:   Problem List Items Addressed This Visit       Cardiovascular and Mediastinum   Benign essential hypertension   Chronic.  Controlled.  Will start Lisinopril  2.5mg  daily for kidney protection.  Labs ordered today.  Return to clinic in 6 months for reevaluation.  Call sooner if concerns arise.       Relevant Medications   rosuvastatin (CRESTOR) 20 MG tablet   lisinopril  (ZESTRIL ) 2.5 MG tablet      Endocrine   Diabetes mellitus type 2, controlled, without complications (HCC) - Primary   Chronic.  Controlled.  Continue with current medication regimen.  Last A1c 6.5%.  Eye exam up to date.  Labs ordered today.  Return to clinic in 6 months for reevaluation.  Call sooner if concerns arise.       Relevant Medications   JANUMET 50-1000 MG tablet   rosuvastatin (CRESTOR) 20 MG tablet   lisinopril  (ZESTRIL ) 2.5 MG tablet   Other Relevant Orders   Comprehensive metabolic panel with GFR   Hemoglobin A1c     Musculoskeletal and Integument   Osteoporosis   Chronic, historic condition Most recent DEXA was done in September 2023 She is currently taking Fosamax  once per week and appears to be tolerating well. Refills provided today Follow-up in 6 months or sooner if concerns arise      Relevant Medications   alendronate  (FOSAMAX ) 70 MG tablet     Other   Mixed hyperlipidemia   Chronic.  Controlled.  Continue with current medication regimen of Crestor 20mg  daily.  Labs ordered today.  Return to clinic in 6 months for reevaluation.  Call sooner if concerns arise.       Relevant Medications   rosuvastatin (CRESTOR) 20 MG tablet   lisinopril  (ZESTRIL ) 2.5 MG tablet   Other Relevant Orders   Lipid panel   Other Visit Diagnoses       Constipation, unspecified constipation type       Recommend changing from Colace to Senna.  Increase water intake to 64 ounces daily.  Increase fiber in diet.  Follow up if not improved, can consider linzess.     Insomnia due to anxiety and fear         Seasonal allergic rhinitis due to pollen       Relevant Medications   montelukast (SINGULAIR) 10 MG tablet     Gastroesophageal reflux disease without esophagitis       Relevant Medications   Docusate Sodium (COLACE PO)   pantoprazole (PROTONIX) 40 MG tablet        Follow up plan: Return in about 6 months (around 08/27/2024) for HTN, HLD, DM2 FU.

## 2024-02-27 LAB — HEMOGLOBIN A1C
Est. average glucose Bld gHb Est-mCnc: 134 mg/dL
Hgb A1c MFr Bld: 6.3 % — ABNORMAL HIGH (ref 4.8–5.6)

## 2024-02-27 LAB — COMPREHENSIVE METABOLIC PANEL WITH GFR
ALT: 18 IU/L (ref 0–32)
AST: 23 IU/L (ref 0–40)
Albumin: 4.5 g/dL (ref 3.8–4.8)
Alkaline Phosphatase: 93 IU/L (ref 44–121)
BUN/Creatinine Ratio: 12 (ref 12–28)
BUN: 12 mg/dL (ref 8–27)
Bilirubin Total: 0.5 mg/dL (ref 0.0–1.2)
CO2: 20 mmol/L (ref 20–29)
Calcium: 9.8 mg/dL (ref 8.7–10.3)
Chloride: 101 mmol/L (ref 96–106)
Creatinine, Ser: 1.03 mg/dL — ABNORMAL HIGH (ref 0.57–1.00)
Globulin, Total: 2.8 g/dL (ref 1.5–4.5)
Glucose: 114 mg/dL — ABNORMAL HIGH (ref 70–99)
Potassium: 4.4 mmol/L (ref 3.5–5.2)
Sodium: 139 mmol/L (ref 134–144)
Total Protein: 7.3 g/dL (ref 6.0–8.5)
eGFR: 57 mL/min/{1.73_m2} — ABNORMAL LOW (ref >59)

## 2024-02-27 LAB — LIPID PANEL
Chol/HDL Ratio: 3.4 ratio (ref 0.0–4.4)
Cholesterol, Total: 156 mg/dL (ref 100–199)
HDL: 46 mg/dL (ref >39)
LDL Chol Calc (NIH): 85 mg/dL (ref 0–99)
Triglycerides: 142 mg/dL (ref 0–149)
VLDL Cholesterol Cal: 25 mg/dL (ref 5–40)

## 2024-03-01 ENCOUNTER — Ambulatory Visit: Payer: Self-pay | Admitting: Nurse Practitioner

## 2024-03-08 ENCOUNTER — Telehealth (INDEPENDENT_AMBULATORY_CARE_PROVIDER_SITE_OTHER): Admitting: Nurse Practitioner

## 2024-03-08 ENCOUNTER — Encounter: Payer: Self-pay | Admitting: Nurse Practitioner

## 2024-03-08 ENCOUNTER — Telehealth: Payer: Self-pay

## 2024-03-08 DIAGNOSIS — K5900 Constipation, unspecified: Secondary | ICD-10-CM

## 2024-03-08 MED ORDER — LINACLOTIDE 72 MCG PO CAPS: 72.0000 ug | ORAL_CAPSULE | Freq: Every day | ORAL | 2 refills | Status: DC

## 2024-03-08 NOTE — Telephone Encounter (Signed)
 Copied from CRM [id number]. Topic: Clinical - Medical Advice >> Mar 08, 2024  8:54 AM Dorthula Gavel H wrote: Reason for CRM: Pt states she was seen in office for constipation on 6/5 and has been taken was was prescribed to her but she is still not getting any relief and would like a call back with advice on what to do.   Please advise?

## 2024-03-08 NOTE — Progress Notes (Signed)
 There were no vitals taken for this visit.   Subjective:    Patient ID: Phyllis Hall, female    DOB: [birth year], 73 y.o.   MRN: [id number]  HPI: [name] is a 73 y.o. female  Chief Complaint  Patient presents with   Constipation   Patient states she has been having issues with bowel movements.  States she can go 2-3 days without going.  She is taking Colace daily.  It doesn't seem to be helping with her symptoms.  She is drinking 4 8 ounces bottles per day.  Since out last visit she tried Senna without improvement.  She is also using Citracal.    Relevant past medical, surgical, family and social history reviewed and updated as indicated. Interim medical history since our last visit reviewed. Allergies and medications reviewed and updated.  Review of Systems  Gastrointestinal:  Positive for constipation.    Per HPI unless specifically indicated above     Objective:    There were no vitals taken for this visit.  Wt Readings from Last 3 Encounters:  02/26/24 168 lb (76.2 kg)  10/31/23 167 lb 6.4 oz (75.9 kg)  06/10/23 163 lb (73.9 kg)    Physical Exam Vitals and nursing note reviewed.  Constitutional:      General: She is not in acute distress.    Appearance: She is not ill-appearing.  HENT:     Head: Normocephalic.     Right Ear: Hearing normal.     Left Ear: Hearing normal.     Nose: Nose normal.  Pulmonary:     Effort: Pulmonary effort is normal. No respiratory distress.   Neurological:     Mental Status: She is alert.   Psychiatric:        Mood and Affect: Mood normal.        Behavior: Behavior normal.        Thought Content: Thought content normal.        Judgment: Judgment normal.     Results for orders placed or performed in visit on 02/26/24  Comprehensive metabolic panel with GFR   Collection Time: 02/26/24 10:56 AM  Result Value Ref Range   Glucose 114 (H) 70 - 99 mg/dL   BUN 12 8 - 27 mg/dL   Creatinine, Ser 5.62 (H) 0.57 -  1.00 mg/dL   eGFR 57 (L) >13 YQ/MVH/8.46   BUN/Creatinine Ratio 12 12 - 28   Sodium 139 134 - 144 mmol/L   Potassium 4.4 3.5 - 5.2 mmol/L   Chloride 101 96 - 106 mmol/L   CO2 20 20 - 29 mmol/L   Calcium  9.8 8.7 - 10.3 mg/dL   Total Protein 7.3 6.0 - 8.5 g/dL   Albumin 4.5 3.8 - 4.8 g/dL   Globulin, Total 2.8 1.5 - 4.5 g/dL   Bilirubin Total 0.5 0.0 - 1.2 mg/dL   Alkaline Phosphatase 93 44 - 121 IU/L   AST 23 0 - 40 IU/L   ALT 18 0 - 32 IU/L  Hemoglobin A1c   Collection Time: 02/26/24 10:56 AM  Result Value Ref Range   Hgb A1c MFr Bld 6.3 (H) 4.8 - 5.6 %   Est. average glucose Bld gHb Est-mCnc 134 mg/dL  Lipid panel   Collection Time: 02/26/24 10:56 AM  Result Value Ref Range   Cholesterol, Total 156 100 - 199 mg/dL   Triglycerides 962 0 - 149 mg/dL   HDL 46 >95 mg/dL   VLDL Cholesterol Cal 25 5 -  40 mg/dL   LDL Chol Calc (NIH) 85 0 - 99 mg/dL   Chol/HDL Ratio 3.4 0.0 - 4.4 ratio      Assessment & Plan:   Problem List Items Addressed This Visit   None Visit Diagnoses       Constipation, unspecified constipation type    -  Primary   Over the counter treatment unsuccessful. Will start Linzess daily. Side effects and benefits of medication discussed during visit. Follow up in 1 month.        Follow up plan: Return in about 1 month (around 04/07/2024) for Medication Management .   This visit was completed via MyChart due to the restrictions of the COVID-19 pandemic. All issues as above were discussed and addressed. Physical exam was done as above through visual confirmation on MyChart. If it was felt that the patient should be evaluated in the office, they were directed there. The patient verbally consented to this visit. Location of the patient: Home Location of the provider: Office Those involved with this call:  Provider: Aileen Alexanders, NP CMA: Mancil Seat, CMA Front Desk/Registration: Jaynee Meyer This encounter was conducted via video.  I spent 30 minutes  dedicated to the care of this patient on the date of this encounter to include previsit review of symptoms, plan of care and follow up, face to face time with the patient, and post visit ordering of testing.

## 2024-03-08 NOTE — Telephone Encounter (Signed)
 Please see if we can get a virtual visit set up to discuss starting medication to help with constipation.

## 2024-03-08 NOTE — Telephone Encounter (Signed)
 Patient is scheduled today at 1:00pm for a VV

## 2024-03-09 NOTE — Progress Notes (Signed)
 Called patient left message for patient to call and schedule appt.

## 2024-04-01 ENCOUNTER — Ambulatory Visit (INDEPENDENT_AMBULATORY_CARE_PROVIDER_SITE_OTHER): Admitting: Nurse Practitioner

## 2024-04-01 ENCOUNTER — Encounter: Payer: Self-pay | Admitting: Nurse Practitioner

## 2024-04-01 VITALS — BP 119/75 | HR 96 | Temp 98.0°F | Ht 67.5 in | Wt 172.6 lb

## 2024-04-01 DIAGNOSIS — K5900 Constipation, unspecified: Secondary | ICD-10-CM | POA: Diagnosis not present

## 2024-04-01 MED ORDER — LINACLOTIDE 145 MCG PO CAPS: 145.0000 ug | ORAL_CAPSULE | Freq: Every day | ORAL | 0 refills | Status: DC

## 2024-04-01 NOTE — Progress Notes (Signed)
 BP 119/75   Pulse 96   Temp 98 F (36.7 C) (Oral)   Ht 5' 7.5 (1.715 m)   Wt 172 lb 9.6 oz (78.3 kg)   SpO2 96%   BMI 26.63 kg/m    Subjective:    Patient ID: Phyllis Hall, female    DOB: 1951-04-29, 73 y.o.   MRN: [id number]  HPI: Phyllis Hall is a 73 y.o. female  Chief Complaint  Patient presents with   Constipation    Linzess  not working as much as patient would like.    Patient states she has been taking the Linzess  for about a month.  States it is moving her bowels but it isn't doing as much as needs it to.  She is taking the medication everyday.  She is drinking as much water as she can.  She feels like she has to really go when she takes the medication then only a small amount comes out.  She did have a good bowel movement yesterday but that is the only once since starting the medication.    Relevant past medical, surgical, family and social history reviewed and updated as indicated. Interim medical history since our last visit reviewed. Allergies and medications reviewed and updated.  Review of Systems  Gastrointestinal:  Positive for constipation.    Per HPI unless specifically indicated above     Objective:    BP 119/75   Pulse 96   Temp 98 F (36.7 C) (Oral)   Ht 5' 7.5 (1.715 m)   Wt 172 lb 9.6 oz (78.3 kg)   SpO2 96%   BMI 26.63 kg/m   Wt Readings from Last 3 Encounters:  04/01/24 172 lb 9.6 oz (78.3 kg)  02/26/24 168 lb (76.2 kg)  10/31/23 167 lb 6.4 oz (75.9 kg)    Physical Exam Vitals and nursing note reviewed.  Constitutional:      General: She is not in acute distress.    Appearance: Normal appearance. She is normal weight. She is not ill-appearing, toxic-appearing or diaphoretic.  HENT:     Head: Normocephalic.     Right Ear: External ear normal.     Left Ear: External ear normal.     Nose: Nose normal.     Mouth/Throat:     Mouth: Mucous membranes are moist.     Pharynx: Oropharynx is clear.  Eyes:     General:         Right eye: No discharge.        Left eye: No discharge.     Extraocular Movements: Extraocular movements intact.     Conjunctiva/sclera: Conjunctivae normal.     Pupils: Pupils are equal, round, and reactive to light.  Cardiovascular:     Rate and Rhythm: Normal rate and regular rhythm.     Heart sounds: No murmur heard. Pulmonary:     Effort: Pulmonary effort is normal. No respiratory distress.     Breath sounds: Normal breath sounds. No wheezing or rales.  Abdominal:     General: Abdomen is flat. Bowel sounds are normal. There is no distension.     Palpations: Abdomen is soft.     Tenderness: There is no abdominal tenderness. There is no right CVA tenderness, left CVA tenderness or guarding.  Musculoskeletal:     Cervical back: Normal range of motion and neck supple.  Skin:    General: Skin is warm and dry.     Capillary Refill: Capillary refill takes less than 2  seconds.  Neurological:     General: No focal deficit present.     Mental Status: She is alert and oriented to person, place, and time. Mental status is at baseline.  Psychiatric:        Mood and Affect: Mood normal.        Behavior: Behavior normal.        Thought Content: Thought content normal.        Judgment: Judgment normal.     Results for orders placed or performed in visit on 02/26/24  Comprehensive metabolic panel with GFR   Collection Time: 02/26/24 10:56 AM  Result Value Ref Range   Glucose 114 (H) 70 - 99 mg/dL   BUN 12 8 - 27 mg/dL   Creatinine, Ser 8.96 (H) 0.57 - 1.00 mg/dL   eGFR 57 (L) >40 fO/fpw/8.26   BUN/Creatinine Ratio 12 12 - 28   Sodium 139 134 - 144 mmol/L   Potassium 4.4 3.5 - 5.2 mmol/L   Chloride 101 96 - 106 mmol/L   CO2 20 20 - 29 mmol/L   Calcium  9.8 8.7 - 10.3 mg/dL   Total Protein 7.3 6.0 - 8.5 g/dL   Albumin 4.5 3.8 - 4.8 g/dL   Globulin, Total 2.8 1.5 - 4.5 g/dL   Bilirubin Total 0.5 0.0 - 1.2 mg/dL   Alkaline Phosphatase 93 44 - 121 IU/L   AST 23 0 - 40 IU/L   ALT 18  0 - 32 IU/L  Hemoglobin A1c   Collection Time: 02/26/24 10:56 AM  Result Value Ref Range   Hgb A1c MFr Bld 6.3 (H) 4.8 - 5.6 %   Est. average glucose Bld gHb Est-mCnc 134 mg/dL  Lipid panel   Collection Time: 02/26/24 10:56 AM  Result Value Ref Range   Cholesterol, Total 156 100 - 199 mg/dL   Triglycerides 857 0 - 149 mg/dL   HDL 46 >60 mg/dL   VLDL Cholesterol Cal 25 5 - 40 mg/dL   LDL Chol Calc (NIH) 85 0 - 99 mg/dL   Chol/HDL Ratio 3.4 0.0 - 4.4 ratio      Assessment & Plan:   Problem List Items Addressed This Visit   None Visit Diagnoses       Constipation, unspecified constipation type    -  Primary   Improved but not resolved.  Will increase Linzess  to 145mcg daily.  Continue with increased water intake.  Follow up in 1 month.  Hall sooner if concerns arise.        Follow up plan: Return in about 1 month (around 05/02/2024) for Constipation.   This visit was completed via MyChart due to the restrictions of the COVID-19 pandemic. All issues as above were discussed and addressed. Physical exam was done as above through visual confirmation on MyChart. If it was felt that the patient should be evaluated in the office, they were directed there. The patient verbally consented to this visit. Location of the patient: Home Location of the provider: Office Those involved with this Hall:  Provider: Darice Petty, NP CMA: Cena Maffucci, CMA Front Desk/Registration: Claretta Maiden This encounter was conducted via video.  I spent 30 minutes dedicated to the care of this patient on the date of this encounter to include previsit review of symptoms, plan of care and follow up, face to face time with the patient, and post visit ordering of testing.

## 2024-04-29 ENCOUNTER — Ambulatory Visit: Payer: Medicare Other | Admitting: Nurse Practitioner

## 2024-05-10 ENCOUNTER — Ambulatory Visit: Admitting: Nurse Practitioner

## 2024-05-10 ENCOUNTER — Encounter: Payer: Self-pay | Admitting: Nurse Practitioner

## 2024-05-10 VITALS — BP 112/78 | HR 79 | Temp 97.8°F | Wt 170.0 lb

## 2024-05-10 DIAGNOSIS — E782 Mixed hyperlipidemia: Secondary | ICD-10-CM

## 2024-05-10 DIAGNOSIS — I10 Essential (primary) hypertension: Secondary | ICD-10-CM

## 2024-05-10 DIAGNOSIS — E119 Type 2 diabetes mellitus without complications: Secondary | ICD-10-CM | POA: Diagnosis not present

## 2024-05-10 MED ORDER — ROSUVASTATIN CALCIUM 20 MG PO TABS: 20.0000 mg | ORAL_TABLET | Freq: Every day | ORAL | 1 refills | Status: AC

## 2024-05-10 MED ORDER — JANUMET 50-1000 MG PO TABS: 1.0000 | ORAL_TABLET | Freq: Two times a day (BID) | ORAL | 1 refills | Status: AC

## 2024-05-10 MED ORDER — LISINOPRIL 2.5 MG PO TABS: 2.5000 mg | ORAL_TABLET | Freq: Every day | ORAL | 1 refills | Status: AC

## 2024-05-10 NOTE — Assessment & Plan Note (Signed)
 Chronic.  Controlled.  Continue with current medication regimen.  Last A1c 6.3%.  Discussed possibly starting GLP1 therapy.  Patient will think about it.  Eye exam up to date.  Labs ordered today.  Return to clinic in 6 months for reevaluation.  Call sooner if concerns arise.

## 2024-05-10 NOTE — Progress Notes (Signed)
 BP 112/78   Pulse 79   Temp 97.8 F (36.6 C) (Oral)   Wt 170 lb (77.1 kg)   SpO2 98%   BMI 26.23 kg/m    Subjective:    Patient ID: Phyllis Hall, female    DOB: 06/11/1951, 73 y.o.   MRN: [id number]  HPI: Phyllis Hall is a 73 y.o. female  Chief Complaint  Patient presents with   Constipation   Patient states she has been taking the Linzess  for about two months.  States that if she takes it everyday she will have too much diarrhea.  It is all liquid stools.  She has been taking the medication every other day with success.   HYPERTENSION / HYPERLIPIDEMIA Satisfied with current treatment? yes Duration of hypertension: years BP monitoring frequency: not checking BP range:  BP medication side effects: no Past BP meds: Lisinopril   Duration of hyperlipidemia: years Cholesterol medication side effects: no Cholesterol supplements: none Past cholesterol medications: rosuvastatin  (crestor ) Medication compliance: good compliance Aspirin: yes Recent stressors: yes Recurrent headaches: no Visual changes: no Palpitations: no Dyspnea: no Chest pain: no Lower extremity edema: no Dizzy/lightheaded: no  Diabetes, Type 2 - Last A1c 6.3 - Medications: Janumet  50-1000 PO BID  - Compliance: good compliance  - Checking BG at home: not checking unless she is feeling bad  - Eye exam: She has plans to schedule this later this year  - Foot exam: due at next apt  - Microalbumin: UTD - Statin: on statin therapy  - PNA vaccine: Completed  - Denies symptoms of hypoglycemia, polyuria, polydipsia, numbness extremities, foot ulcers/trauma  Relevant past medical, surgical, family and social history reviewed and updated as indicated. Interim medical history since our last visit reviewed. Allergies and medications reviewed and updated.  Review of Systems  Eyes:  Negative for visual disturbance.  Respiratory:  Negative for cough, chest tightness and shortness of breath.    Cardiovascular:  Negative for chest pain, palpitations and leg swelling.  Gastrointestinal:  Positive for constipation.  Endocrine: Negative for polydipsia and polyuria.  Neurological:  Negative for dizziness, numbness and headaches.    Per HPI unless specifically indicated above     Objective:    BP 112/78   Pulse 79   Temp 97.8 F (36.6 C) (Oral)   Wt 170 lb (77.1 kg)   SpO2 98%   BMI 26.23 kg/m   Wt Readings from Last 3 Encounters:  05/10/24 170 lb (77.1 kg)  04/01/24 172 lb 9.6 oz (78.3 kg)  02/26/24 168 lb (76.2 kg)    Physical Exam Vitals and nursing note reviewed.  Constitutional:      General: She is not in acute distress.    Appearance: Normal appearance. She is normal weight. She is not ill-appearing, toxic-appearing or diaphoretic.  HENT:     Head: Normocephalic.     Right Ear: External ear normal.     Left Ear: External ear normal.     Nose: Nose normal.     Mouth/Throat:     Mouth: Mucous membranes are moist.     Pharynx: Oropharynx is clear.  Eyes:     General:        Right eye: No discharge.        Left eye: No discharge.     Extraocular Movements: Extraocular movements intact.     Conjunctiva/sclera: Conjunctivae normal.     Pupils: Pupils are equal, round, and reactive to light.  Cardiovascular:     Rate and  Rhythm: Normal rate and regular rhythm.     Heart sounds: No murmur heard. Pulmonary:     Effort: Pulmonary effort is normal. No respiratory distress.     Breath sounds: Normal breath sounds. No wheezing or rales.  Abdominal:     General: Abdomen is flat. Bowel sounds are normal. There is no distension.     Palpations: Abdomen is soft.     Tenderness: There is no abdominal tenderness. There is no right CVA tenderness, left CVA tenderness or guarding.  Musculoskeletal:     Cervical back: Normal range of motion and neck supple.  Skin:    General: Skin is warm and dry.     Capillary Refill: Capillary refill takes less than 2 seconds.   Neurological:     General: No focal deficit present.     Mental Status: She is alert and oriented to person, place, and time. Mental status is at baseline.  Psychiatric:        Mood and Affect: Mood normal.        Behavior: Behavior normal.        Thought Content: Thought content normal.        Judgment: Judgment normal.     Results for orders placed or performed in visit on 02/26/24  Comprehensive metabolic panel with GFR   Collection Time: 02/26/24 10:56 AM  Result Value Ref Range   Glucose 114 (H) 70 - 99 mg/dL   BUN 12 8 - 27 mg/dL   Creatinine, Ser 8.96 (H) 0.57 - 1.00 mg/dL   eGFR 57 (L) >40 fO/fpw/8.26   BUN/Creatinine Ratio 12 12 - 28   Sodium 139 134 - 144 mmol/L   Potassium 4.4 3.5 - 5.2 mmol/L   Chloride 101 96 - 106 mmol/L   CO2 20 20 - 29 mmol/L   Calcium  9.8 8.7 - 10.3 mg/dL   Total Protein 7.3 6.0 - 8.5 g/dL   Albumin 4.5 3.8 - 4.8 g/dL   Globulin, Total 2.8 1.5 - 4.5 g/dL   Bilirubin Total 0.5 0.0 - 1.2 mg/dL   Alkaline Phosphatase 93 44 - 121 IU/L   AST 23 0 - 40 IU/L   ALT 18 0 - 32 IU/L  Hemoglobin A1c   Collection Time: 02/26/24 10:56 AM  Result Value Ref Range   Hgb A1c MFr Bld 6.3 (H) 4.8 - 5.6 %   Est. average glucose Bld gHb Est-mCnc 134 mg/dL  Lipid panel   Collection Time: 02/26/24 10:56 AM  Result Value Ref Range   Cholesterol, Total 156 100 - 199 mg/dL   Triglycerides 857 0 - 149 mg/dL   HDL 46 >60 mg/dL   VLDL Cholesterol Cal 25 5 - 40 mg/dL   LDL Chol Calc (NIH) 85 0 - 99 mg/dL   Chol/HDL Ratio 3.4 0.0 - 4.4 ratio      Assessment & Plan:   Problem List Items Addressed This Visit       Cardiovascular and Mediastinum   Benign essential hypertension   Chronic.  Controlled.  Will start Lisinopril  2.5mg  daily for kidney protection.  Labs ordered today.  Return to clinic in 6 months for reevaluation.  Hall sooner if concerns arise.       Relevant Medications   lisinopril  (ZESTRIL ) 2.5 MG tablet   rosuvastatin  (CRESTOR ) 20 MG tablet      Endocrine   Diabetes mellitus type 2, controlled, without complications (HCC) - Primary   Chronic.  Controlled.  Continue with current medication regimen.  Last A1c 6.3%.  Discussed possibly starting GLP1 therapy.  Patient will think about it.  Eye exam up to date.  Labs ordered today.  Return to clinic in 6 months for reevaluation.  Hall sooner if concerns arise.       Relevant Medications   JANUMET  50-1000 MG tablet   lisinopril  (ZESTRIL ) 2.5 MG tablet   rosuvastatin  (CRESTOR ) 20 MG tablet   Other Relevant Orders   Comp Met (CMET)   HgB A1c     Other   Mixed hyperlipidemia   Chronic.  Controlled.  Continue with current medication regimen of Crestor  20mg  daily.  Labs ordered today.  Return to clinic in 6 months for reevaluation.  Hall sooner if concerns arise.       Relevant Medications   lisinopril  (ZESTRIL ) 2.5 MG tablet   rosuvastatin  (CRESTOR ) 20 MG tablet   Other Relevant Orders   Lipid panel      Follow up plan: Return in about 6 months (around 11/10/2024) for HTN, HLD, DM2 FU.   This visit was completed via MyChart due to the restrictions of the COVID-19 pandemic. All issues as above were discussed and addressed. Physical exam was done as above through visual confirmation on MyChart. If it was felt that the patient should be evaluated in the office, they were directed there. The patient verbally consented to this visit. Location of the patient: Home Location of the provider: Office Those involved with this Hall:  Provider: Darice Petty, NP CMA: Cena Maffucci, CMA Front Desk/Registration: Claretta Maiden This encounter was conducted via video.  I spent 30 minutes dedicated to the care of this patient on the date of this encounter to include previsit review of symptoms, plan of care and follow up, face to face time with the patient, and post visit ordering of testing.

## 2024-05-10 NOTE — Assessment & Plan Note (Signed)
Chronic.  Controlled.  Continue with current medication regimen of Crestor 20mg daily.  Labs ordered today.  Return to clinic in 6 months for reevaluation.  Call sooner if concerns arise.   

## 2024-05-10 NOTE — Assessment & Plan Note (Signed)
 Chronic.  Controlled.  Will start Lisinopril  2.5mg  daily for kidney protection.  Labs ordered today.  Return to clinic in 6 months for reevaluation.  Call sooner if concerns arise.

## 2024-05-11 ENCOUNTER — Ambulatory Visit: Payer: Self-pay | Admitting: Nurse Practitioner

## 2024-05-11 LAB — COMPREHENSIVE METABOLIC PANEL WITH GFR
ALT: 15 IU/L (ref 0–32)
AST: 20 IU/L (ref 0–40)
Albumin: 4.4 g/dL (ref 3.8–4.8)
Alkaline Phosphatase: 87 IU/L (ref 44–121)
BUN/Creatinine Ratio: 12 (ref 12–28)
BUN: 11 mg/dL (ref 8–27)
Bilirubin Total: 0.4 mg/dL (ref 0.0–1.2)
CO2: 24 mmol/L (ref 20–29)
Calcium: 9.6 mg/dL (ref 8.7–10.3)
Chloride: 102 mmol/L (ref 96–106)
Creatinine, Ser: 0.95 mg/dL (ref 0.57–1.00)
Globulin, Total: 2.5 g/dL (ref 1.5–4.5)
Glucose: 116 mg/dL — ABNORMAL HIGH (ref 70–99)
Potassium: 4.7 mmol/L (ref 3.5–5.2)
Sodium: 140 mmol/L (ref 134–144)
Total Protein: 6.9 g/dL (ref 6.0–8.5)
eGFR: 63 mL/min/1.73 (ref >59)

## 2024-05-11 LAB — LIPID PANEL
Chol/HDL Ratio: 2.5 ratio (ref 0.0–4.4)
Cholesterol, Total: 131 mg/dL (ref 100–199)
HDL: 53 mg/dL (ref >39)
LDL Chol Calc (NIH): 55 mg/dL (ref 0–99)
Triglycerides: 129 mg/dL (ref 0–149)
VLDL Cholesterol Cal: 23 mg/dL (ref 5–40)

## 2024-05-11 LAB — HEMOGLOBIN A1C
Est. average glucose Bld gHb Est-mCnc: 134 mg/dL
Hgb A1c MFr Bld: 6.3 % — ABNORMAL HIGH (ref 4.8–5.6)

## 2024-05-18 ENCOUNTER — Other Ambulatory Visit: Payer: Self-pay | Admitting: Internal Medicine

## 2024-05-18 DIAGNOSIS — K219 Gastro-esophageal reflux disease without esophagitis: Secondary | ICD-10-CM

## 2024-05-20 ENCOUNTER — Other Ambulatory Visit: Payer: Self-pay | Admitting: Internal Medicine

## 2024-05-20 DIAGNOSIS — J301 Allergic rhinitis due to pollen: Secondary | ICD-10-CM

## 2024-07-02 ENCOUNTER — Other Ambulatory Visit: Payer: Self-pay | Admitting: Nurse Practitioner

## 2024-07-05 NOTE — Telephone Encounter (Signed)
 Requested Prescriptions  Pending Prescriptions Disp Refills   linaclotide  (LINZESS ) 145 MCG CAPS capsule [Pharmacy Med Name: LINZESS  CAPSULES] 90 capsule 1    Sig: TAKE 1 CAPSULE(145 MCG) BY MOUTH DAILY BEFORE BREAKFAST     Gastroenterology: Irritable Bowel Syndrome Passed - 07/05/2024  2:34 PM      Passed - Valid encounter within last 12 months    Recent Outpatient Visits           1 month ago Controlled type 2 diabetes mellitus without complication, without long-term current use of insulin (HCC)   [place] [facility] Melvin Pao, NP   3 months ago Constipation, unspecified constipation type   [facility] Melvin Pao, NP   3 months ago Constipation, unspecified constipation type   [facility] Melvin Pao, NP   4 months ago Controlled type 2 diabetes mellitus without complication, without long-term current use of insulin (HCC)   [place] [facility] Melvin Pao, NP   8 months ago Encounter for annual wellness exam in Medicare patient   [place] [facility] Melvin Pao, NP

## 2024-07-30 ENCOUNTER — Ambulatory Visit: Payer: Self-pay

## 2024-07-30 NOTE — Telephone Encounter (Signed)
 FYI Only or Action Required?: Action required by provider: clinical question for provider and patient is wanting to see if provider will send in medication for her. Needing a call back from office.  Patient was last seen in primary care on 05/10/2024 by Melvin Pao, NP.  Called Nurse Triage reporting Cough.  Symptoms began 3 days ago.  Interventions attempted: Rest, hydration, or home remedies.  Symptoms are: unchanged.  Triage Disposition: See Physician Within 24 Hours  Patient/caregiver understands and will follow disposition?: Yes   Copied from CRM [id number]. Topic: Clinical - Red Word Triage >> Jul 30, 2024  8:35 AM Wess RAMAN wrote: Red Word that prompted transfer to Nurse Triage: Horrible cough, worsens at night, lack of energy, chest pain. Reason for Disposition  SEVERE coughing spells (e.g., whooping sound after coughing, vomiting after coughing)  Answer Assessment - Initial Assessment Questions Patient reporting a dry severe cough that has been going on for three days. Reports some shortness of breath with ambulation. No fever. Discussed with patient what her options would be to be seen. No availability per decision tree for her PCP. Appointments available at an alternative clinic. Patient states she would like for PCP to send in medication for her to her pharmacy if possible. Patient is asking for a call back from office.    1. ONSET: When did the cough begin?      Started three days ago 2. SEVERITY: How bad is the cough today?      severe 3. SPUTUM: Describe the color of your sputum (e.g., none, dry cough; clear, white, yellow, green)     Dry cough 4. HEMOPTYSIS: Are you coughing up any blood? If Yes, ask: How much? (e.g., flecks, streaks, tablespoons, etc.)     no 5. DIFFICULTY BREATHING: Are you having difficulty breathing? If Yes, ask: How bad is it? (e.g., mild, moderate, severe)      Mild-shortness of breath with ambulation at times 6. FEVER: Do  you have a fever? If Yes, ask: What is your temperature, how was it measured, and when did it start?     no 7. CARDIAC HISTORY: Do you have any history of heart disease? (e.g., heart attack, congestive heart failure)      no 8. LUNG HISTORY: Do you have any history of lung disease?  (e.g., pulmonary embolus, asthma, emphysema)     no 9. PE RISK FACTORS: Do you have a history of blood clots? (or: recent major surgery, recent prolonged travel, bedridden)     no 10. OTHER SYMPTOMS: Do you have any other symptoms? (e.g., runny nose, wheezing, chest pain)       Runny nose 11. TRAVEL: Have you traveled out of the country in the last month? (e.g., travel history, exposures)       no  Protocols used: Cough - Acute Non-Productive-A-AH

## 2024-07-30 NOTE — Telephone Encounter (Signed)
 Patient needs an appt.

## 2024-07-30 NOTE — Telephone Encounter (Signed)
 Please call and schedule appointment for patient. Medication cannot be sent in without an appointment.

## 2024-08-02 NOTE — Telephone Encounter (Signed)
 Patient no longer needs an appt, She took something for her cough and has it under control

## 2024-10-19 ENCOUNTER — Ambulatory Visit: Admitting: Nurse Practitioner

## 2024-10-29 ENCOUNTER — Encounter: Payer: Self-pay | Admitting: Internal Medicine

## 2024-10-29 ENCOUNTER — Ambulatory Visit: Payer: Self-pay | Admitting: Internal Medicine

## 2024-10-29 VITALS — BP 106/84 | HR 88 | Ht 67.5 in | Wt 160.0 lb

## 2024-10-29 DIAGNOSIS — R5383 Other fatigue: Secondary | ICD-10-CM

## 2024-10-29 DIAGNOSIS — Z13820 Encounter for screening for osteoporosis: Secondary | ICD-10-CM

## 2024-10-29 DIAGNOSIS — M1990 Unspecified osteoarthritis, unspecified site: Secondary | ICD-10-CM

## 2024-10-29 DIAGNOSIS — H539 Unspecified visual disturbance: Secondary | ICD-10-CM

## 2024-10-29 DIAGNOSIS — F411 Generalized anxiety disorder: Secondary | ICD-10-CM

## 2024-10-29 DIAGNOSIS — M810 Age-related osteoporosis without current pathological fracture: Secondary | ICD-10-CM

## 2024-10-29 DIAGNOSIS — E1169 Type 2 diabetes mellitus with other specified complication: Secondary | ICD-10-CM

## 2024-10-29 DIAGNOSIS — I152 Hypertension secondary to endocrine disorders: Secondary | ICD-10-CM

## 2024-10-29 DIAGNOSIS — R21 Rash and other nonspecific skin eruption: Secondary | ICD-10-CM

## 2024-10-29 DIAGNOSIS — E119 Type 2 diabetes mellitus without complications: Secondary | ICD-10-CM

## 2024-10-29 DIAGNOSIS — Z1231 Encounter for screening mammogram for malignant neoplasm of breast: Secondary | ICD-10-CM

## 2024-10-29 LAB — POC CREATINE & ALBUMIN,URINE
Creatinine, POC: 300 mg/dL
Microalbumin Ur, POC: 80 mg/L

## 2024-10-29 LAB — POCT CBG (FASTING - GLUCOSE)-MANUAL ENTRY: Glucose Fasting, POC: 120 mg/dL — AB (ref 70–99)

## 2024-10-29 MED ORDER — ALENDRONATE SODIUM 70 MG PO TABS: 70.0000 mg | ORAL_TABLET | ORAL | 0 refills | Status: AC

## 2024-10-29 MED ORDER — VALACYCLOVIR HCL 1 G PO TABS: 1000.0000 mg | ORAL_TABLET | Freq: Two times a day (BID) | ORAL | 0 refills | Status: AC

## 2024-10-29 MED ORDER — DULOXETINE HCL 30 MG PO CPEP: 30.0000 mg | ORAL_CAPSULE | Freq: Every day | ORAL | 2 refills | Status: AC

## 2024-10-29 NOTE — Progress Notes (Signed)
 "  New Patient Office Visit  Subjective   Patient ID: Phyllis Hall, female    DOB: 06-19-1951  Age: 74 y.o. MRN: [id number]  CC:  Chief Complaint  Patient presents with   Establish Care    Re-establish care    HPI Phyllis Hall presents to establish care Previous Primary Care provider/office:   she does have additional concerns to discuss today.   Patient comes in today to reestablish PMD.  She was last seen here in 2023.   Today she presents with several complaints.  She reports of an episode of visual blurring , likely gray curtain falling in the upper visual field ,while driving yesterday.  It resolved within a few seconds.  She has been told that she needs cataract surgery especially in her left eye and also has history of floaters.  Will set up ophthalmology referral today.  Denies headaches or dizziness, no nausea or vomiting, no tingling or numbness around her face. Patient also reports of an eruption of a pruritic rash on her lower back.  This rash is recurrent and she has used steroid cream to treat the itching.  The rash consists of red papules and vesicles in an area of 2 x 2 cm.  She reports of arthritic aches and pains, almost all her joints hurt. Patient also reports of feeling anxious and depressed recently, due to circumstantial stress. Her PHQ-9/GAD score is 18/8.  She is agreeable to starting a small dose Cymbalta  30 mg/day.    Outpatient Encounter Medications as of 10/29/2024  Medication Sig   ascorbic acid (VITAMIN C) 500 MG tablet Take 500 mg by mouth daily.   calcium  carbonate (OSCAL) 1500 (600 Ca) MG TABS tablet Take 600 mg of elemental calcium  by mouth 2 (two) times daily with a meal.   cholecalciferol (VITAMIN D3) 25 MCG (1000 UNIT) tablet Take 1,000 Units by mouth daily.   clobetasol  ointment (TEMOVATE ) 0.05 % Apply topically 2 (two) times daily.   DULoxetine  (CYMBALTA ) 30 MG capsule Take 1 capsule (30 mg total) by mouth daily.   fluticasone  (FLONASE) 50 MCG/ACT nasal spray Place into both nostrils daily.   JANUMET  50-1000 MG tablet Take 1 tablet by mouth 2 (two) times daily with a meal.   linaclotide  (LINZESS ) 145 MCG CAPS capsule TAKE 1 CAPSULE(145 MCG) BY MOUTH DAILY BEFORE BREAKFAST   lisinopril  (ZESTRIL ) 2.5 MG tablet Take 1 tablet (2.5 mg total) by mouth daily.   montelukast  (SINGULAIR ) 10 MG tablet Take 1 tablet (10 mg total) by mouth daily.   Multiple Vitamin (MULTIVITAMIN WITH MINERALS) TABS tablet Take 1 tablet by mouth daily.   mupirocin  ointment (BACTROBAN ) 2 % Apply 1 Application topically 2 (two) times daily.   pantoprazole  (PROTONIX ) 40 MG tablet Take 1 tablet (40 mg total) by mouth daily.   rosuvastatin  (CRESTOR ) 20 MG tablet Take 1 tablet (20 mg total) by mouth daily.   valACYclovir  (VALTREX ) 1000 MG tablet Take 1 tablet (1,000 mg total) by mouth 2 (two) times daily.   [DISCONTINUED] alendronate  (FOSAMAX ) 70 MG tablet Take 1 tablet (70 mg total) by mouth once a week. Take with a full glass of water on an empty stomach.   alendronate  (FOSAMAX ) 70 MG tablet Take 1 tablet (70 mg total) by mouth once a week. Take with a full glass of water on an empty stomach.   Docusate Sodium (COLACE PO) Take by mouth. (Patient not taking: Reported on 10/29/2024)   No facility-administered encounter medications on file as  of 10/29/2024.    Past Medical History:  Diagnosis Date   Breast cancer [facility]) 2006   right breast with lumpectomy and rad tx   Diabetes mellitus without complication (HCC)    GERD (gastroesophageal reflux disease)    Hyperlipidemia    Melanoma (HCC)     Past Surgical History:  Procedure Laterality Date   BREAST BIOPSY Right 2006   Breast Cancer   BREAST LUMPECTOMY     COLONOSCOPY WITH PROPOFOL  N/A 12/04/2015   Procedure: COLONOSCOPY WITH PROPOFOL ;  Surgeon: Donnice Vaughn Manes, MD;  Location: [facility] ENDOSCOPY;  Service: Endoscopy;  Laterality: N/A;   COLONOSCOPY WITH PROPOFOL  N/A 01/08/2023   Procedure:  COLONOSCOPY WITH PROPOFOL ;  Surgeon: Toledo, Ladell POUR, MD;  Location: [facility] ENDOSCOPY;  Service: Gastroenterology;  Laterality: N/A;  DM   ESOPHAGOGASTRODUODENOSCOPY (EGD) WITH PROPOFOL  N/A 01/08/2023   Procedure: ESOPHAGOGASTRODUODENOSCOPY (EGD) WITH PROPOFOL ;  Surgeon: Toledo, Ladell POUR, MD;  Location: [facility] ENDOSCOPY;  Service: Gastroenterology;  Laterality: N/A;   MASTECTOMY, PARTIAL      Family History  Problem Relation Age of Onset   Breast cancer Maternal Aunt 70    Social History   Socioeconomic History   Marital status: Widowed    Spouse name: Not on file   Number of children: Not on file   Years of education: Not on file   Highest education level: Not on file  Occupational History   Not on file  Tobacco Use   Smoking status: Former    Current packs/day: 0.00    Types: Cigarettes    Quit date: 10/13/1993    Years since quitting: 31.0   Smokeless tobacco: Never  Vaping Use   Vaping status: Never Used  Substance and Sexual Activity   Alcohol use: Not Currently   Drug use: No   Sexual activity: Not Currently  Other Topics Concern   Not on file  Social History Narrative   Not on file   Social Drivers of Health   Tobacco Use: Medium Risk (10/29/2024)   Patient History    Smoking Tobacco Use: Former    Smokeless Tobacco Use: Never    Passive Exposure: Not on file  Financial Resource Strain: Patient Declined (10/28/2023)   Overall Financial Resource Strain (CARDIA)    Difficulty of Paying Living Expenses: Patient declined  Food Insecurity: Patient Declined (10/28/2023)   Hunger Vital Sign    Worried About Running Out of Food in the Last Year: Patient declined    Ran Out of Food in the Last Year: Patient declined  Transportation Needs: No Transportation Needs (10/28/2023)   PRAPARE - Administrator, Civil Service (Medical): No    Lack of Transportation (Non-Medical): No  Physical Activity: Insufficiently Active (10/28/2023)   Exercise Vital Sign    Days of  Exercise per Week: 2 days    Minutes of Exercise per Session: 10 min  Stress: No Stress Concern Present (10/28/2023)   [employer] of Occupational Health - Occupational Stress Questionnaire    Feeling of Stress : Only a little  Social Connections: Unknown (10/28/2023)   Social Connection and Isolation Panel    Frequency of Communication with Friends and Family: Once a week    Frequency of Social Gatherings with Friends and Family: Patient declined    Attends Religious Services: Patient declined    Database Administrator or Organizations: No    Attends Engineer, Structural: Not on file    Marital Status: Widowed  Intimate Partner Violence: Not  on file  Depression (PHQ2-9): High Risk (10/29/2024)   Depression (PHQ2-9)    PHQ-2 Score: 18  Alcohol Screen: Not on file  Housing: Unknown (10/28/2023)   Housing Stability Vital Sign    Unable to Pay for Housing in the Last Year: No    Number of Times Moved in the Last Year: Not on file    Homeless in the Last Year: No  Utilities: Not on file  Health Literacy: Not on file    Review of Systems  Constitutional: Negative.  Negative for chills, fever and malaise/fatigue.  HENT: Negative.  Negative for congestion and sore throat.   Eyes: Negative.  Negative for blurred vision and pain.  Respiratory: Negative.  Negative for cough and shortness of breath.   Cardiovascular: Negative.  Negative for chest pain, palpitations and leg swelling.  Gastrointestinal: Negative.  Negative for abdominal pain, blood in stool, constipation, diarrhea, heartburn, melena, nausea and vomiting.  Genitourinary: Negative.  Negative for dysuria, flank pain, frequency and urgency.  Musculoskeletal:  Positive for joint pain. Negative for myalgias.  Skin:  Positive for itching and rash.  Neurological: Negative.  Negative for dizziness, tingling, sensory change, weakness and headaches.  Endo/Heme/Allergies: Negative.   Psychiatric/Behavioral:  Positive for  depression. Negative for suicidal ideas. The patient is nervous/anxious.         Objective   BP 106/84   Pulse 88   Ht 5' 7.5 (1.715 m)   Wt 160 lb (72.6 kg)   SpO2 97%   BMI 24.69 kg/m   Physical Exam Vitals and nursing note reviewed.  Constitutional:      Appearance: Normal appearance.  HENT:     Head: Normocephalic and atraumatic.     Nose: Nose normal.     Mouth/Throat:     Mouth: Mucous membranes are moist.     Pharynx: Oropharynx is clear.  Eyes:     Conjunctiva/sclera: Conjunctivae normal.     Pupils: Pupils are equal, round, and reactive to light.  Cardiovascular:     Rate and Rhythm: Normal rate and regular rhythm.     Pulses: Normal pulses.     Heart sounds: Normal heart sounds. No murmur heard. Pulmonary:     Effort: Pulmonary effort is normal.     Breath sounds: Normal breath sounds. No wheezing.  Abdominal:     General: Bowel sounds are normal.     Palpations: Abdomen is soft.     Tenderness: There is no abdominal tenderness. There is no right CVA tenderness or left CVA tenderness.  Musculoskeletal:        General: Normal range of motion.     Cervical back: Normal range of motion.     Right lower leg: No edema.     Left lower leg: No edema.  Skin:    General: Skin is warm and dry.  Neurological:     General: No focal deficit present.     Mental Status: She is alert and oriented to person, place, and time.  Psychiatric:        Mood and Affect: Mood normal.        Behavior: Behavior normal.        Assessment & Plan:  Check labs today.  Continue medications.  Ophthalmology referral.  Start Cymbalta . Also need to schedule her mammogram and DEXA scan. Problem List Items Addressed This Visit       Endocrine   Diabetes mellitus type 2, controlled, without complications (HCC) - Primary   Relevant Orders  POCT CBG (Fasting - Glucose) (Completed)   Ambulatory referral to Ophthalmology   POC CREATINE & ALBUMIN,URINE (Completed)      Musculoskeletal and Integument   Osteoporosis   Relevant Medications   alendronate  (FOSAMAX ) 70 MG tablet   Other Relevant Orders   DG Bone Density   Other Visit Diagnoses       Rash       Relevant Medications   valACYclovir  (VALTREX ) 1000 MG tablet   Other Relevant Orders   HSV 1 and 2 Ab, IgG     Episode of visual disturbance       Relevant Orders   Ambulatory referral to Ophthalmology     Combined hyperlipidemia associated with type 2 diabetes mellitus (HCC)       Relevant Orders   Lipid Panel w/o Chol/HDL Ratio     GAD (generalized anxiety disorder)       Relevant Medications   DULoxetine  (CYMBALTA ) 30 MG capsule     Hypertension associated with diabetes (HCC)       Relevant Orders   CMP14+EGFR     Arthritis       Relevant Orders   Arthritis Panel     Other fatigue       Relevant Orders   CBC with Diff   TSH+T4F+T3Free     Screening for osteoporosis         Breast cancer screening by mammogram       Relevant Orders   MM 3D SCREENING MAMMOGRAM BILATERAL BREAST       Return in about 10 days (around 11/08/2024).   Total time spent: 30 minutes. This time includes review of previous notes and results and patient face to face interaction during today's visit.    FERNAND FREDY RAMAN, MD  10/29/2024   This document may have been prepared by [facility] Voice Recognition software and as such may include unintentional dictation errors.  "

## 2024-11-03 ENCOUNTER — Encounter: Admitting: Nurse Practitioner

## 2024-11-08 ENCOUNTER — Ambulatory Visit: Admitting: Internal Medicine

## 2024-11-12 ENCOUNTER — Ambulatory Visit: Admitting: Nurse Practitioner

## 2024-12-01 ENCOUNTER — Other Ambulatory Visit
